# Patient Record
Sex: Male | Born: 1935 | Race: White | Hispanic: No | Marital: Married | Smoking: Former smoker
Health system: Southern US, Community
[De-identification: ages and names within clinical notes are randomized; demographics above are authoritative.]

## PROBLEM LIST (undated history)

## (undated) DIAGNOSIS — K219 Gastro-esophageal reflux disease without esophagitis: Secondary | ICD-10-CM

## (undated) DIAGNOSIS — I4891 Unspecified atrial fibrillation: Secondary | ICD-10-CM

## (undated) DIAGNOSIS — E785 Hyperlipidemia, unspecified: Secondary | ICD-10-CM

## (undated) DIAGNOSIS — I2510 Atherosclerotic heart disease of native coronary artery without angina pectoris: Secondary | ICD-10-CM

## (undated) DIAGNOSIS — I272 Pulmonary hypertension, unspecified: Secondary | ICD-10-CM

## (undated) DIAGNOSIS — J189 Pneumonia, unspecified organism: Secondary | ICD-10-CM

## (undated) DIAGNOSIS — J449 Chronic obstructive pulmonary disease, unspecified: Secondary | ICD-10-CM

## (undated) DIAGNOSIS — I10 Essential (primary) hypertension: Secondary | ICD-10-CM

## (undated) DIAGNOSIS — J9610 Chronic respiratory failure, unspecified whether with hypoxia or hypercapnia: Secondary | ICD-10-CM

---

## 2013-05-09 ENCOUNTER — Inpatient Hospital Stay
Admission: AD | Admit: 2013-05-09 | Discharge: 2013-06-01 | Disposition: A | Discharge status: Home Health | Payer: Medicare Other | Source: Ambulatory Visit | Attending: Internal Medicine | Admitting: Internal Medicine

## 2013-05-09 DIAGNOSIS — J9620 Acute and chronic respiratory failure, unspecified whether with hypoxia or hypercapnia: Secondary | ICD-10-CM

## 2013-05-09 DIAGNOSIS — J439 Emphysema, unspecified: Secondary | ICD-10-CM

## 2013-05-09 DIAGNOSIS — J189 Pneumonia, unspecified organism: Secondary | ICD-10-CM

## 2013-05-09 HISTORY — DX: Pulmonary hypertension, unspecified: I27.20

## 2013-05-09 HISTORY — DX: Chronic obstructive pulmonary disease, unspecified: J44.9

## 2013-05-09 HISTORY — DX: Unspecified atrial fibrillation: I48.91

## 2013-05-09 HISTORY — DX: Chronic respiratory failure, unspecified whether with hypoxia or hypercapnia: J96.10

## 2013-05-09 HISTORY — DX: Gastro-esophageal reflux disease without esophagitis: K21.9

## 2013-05-09 HISTORY — DX: Pneumonia, unspecified organism: J18.9

## 2013-05-09 HISTORY — DX: Essential (primary) hypertension: I10

## 2013-05-09 HISTORY — DX: Atherosclerotic heart disease of native coronary artery without angina pectoris: I25.10

## 2013-05-09 HISTORY — DX: Hyperlipidemia, unspecified: E78.5

## 2013-05-10 ENCOUNTER — Other Ambulatory Visit (HOSPITAL_COMMUNITY): Payer: Medicare Other

## 2013-05-10 LAB — COMPREHENSIVE METABOLIC PANEL
ALT: 12 U/L (ref 0–53)
AST: 14 U/L (ref 0–37)
Albumin: 2.6 g/dL — ABNORMAL LOW (ref 3.5–5.2)
Alkaline Phosphatase: 90 U/L (ref 39–117)
BILIRUBIN TOTAL: 1.3 mg/dL — AB (ref 0.3–1.2)
BUN: 39 mg/dL — AB (ref 6–23)
CHLORIDE: 102 meq/L (ref 96–112)
CO2: 28 meq/L (ref 19–32)
CREATININE: 1.24 mg/dL (ref 0.50–1.35)
Calcium: 8.5 mg/dL (ref 8.4–10.5)
GFR calc Af Amer: 62 mL/min — ABNORMAL LOW (ref >90)
GFR, EST NON AFRICAN AMERICAN: 54 mL/min — AB (ref >90)
Glucose, Bld: 221 mg/dL — ABNORMAL HIGH (ref 70–99)
Potassium: 4.2 mEq/L (ref 3.7–5.3)
Sodium: 144 mEq/L (ref 137–147)
Total Protein: 6.1 g/dL (ref 6.0–8.3)

## 2013-05-10 LAB — BLOOD GAS, ARTERIAL
Acid-Base Excess: 6.2 mmol/L — ABNORMAL HIGH (ref 0.0–2.0)
BICARBONATE: 30.1 meq/L — AB (ref 20.0–24.0)
FIO2: 0.7 %
O2 Saturation: 96 %
PATIENT TEMPERATURE: 98.6
PH ART: 7.456 — AB (ref 7.350–7.450)
PO2 ART: 77.4 mmHg — AB (ref 80.0–100.0)
TCO2: 31.4 mmol/L (ref 0–100)
pCO2 arterial: 43.3 mmHg (ref 35.0–45.0)

## 2013-05-10 LAB — CBC WITH DIFFERENTIAL/PLATELET
Basophils Absolute: 0 10*3/uL (ref 0.0–0.1)
Basophils Relative: 0 % (ref 0–1)
Eosinophils Absolute: 0.3 10*3/uL (ref 0.0–0.7)
Eosinophils Relative: 2 % (ref 0–5)
HEMATOCRIT: 24.8 % — AB (ref 39.0–52.0)
HEMOGLOBIN: 7.4 g/dL — AB (ref 13.0–17.0)
LYMPHS PCT: 11 % — AB (ref 12–46)
Lymphs Abs: 1.6 10*3/uL (ref 0.7–4.0)
MCH: 27.1 pg (ref 26.0–34.0)
MCHC: 29.8 g/dL — AB (ref 30.0–36.0)
MCV: 90.8 fL (ref 78.0–100.0)
MONO ABS: 1.6 10*3/uL — AB (ref 0.1–1.0)
MONOS PCT: 11 % (ref 3–12)
Neutro Abs: 11.7 10*3/uL — ABNORMAL HIGH (ref 1.7–7.7)
Neutrophils Relative %: 76 % (ref 43–77)
Platelets: 375 10*3/uL (ref 150–400)
RBC: 2.73 MIL/uL — ABNORMAL LOW (ref 4.22–5.81)
RDW: 17.5 % — ABNORMAL HIGH (ref 11.5–15.5)
WBC: 15.4 10*3/uL — AB (ref 4.0–10.5)

## 2013-05-10 LAB — VITAMIN B12: VITAMIN B 12: 345 pg/mL (ref 211–911)

## 2013-05-10 LAB — MAGNESIUM: MAGNESIUM: 2.1 mg/dL (ref 1.5–2.5)

## 2013-05-10 LAB — PREPARE RBC (CROSSMATCH)

## 2013-05-10 LAB — PHOSPHORUS: Phosphorus: 3 mg/dL (ref 2.3–4.6)

## 2013-05-10 LAB — PREALBUMIN: Prealbumin: 12.7 mg/dL — ABNORMAL LOW (ref 17.0–34.0)

## 2013-05-10 LAB — PROCALCITONIN: Procalcitonin: <0.1 ng/mL

## 2013-05-10 LAB — TSH: TSH: 1.08 u[IU]/mL (ref 0.350–4.500)

## 2013-05-10 LAB — ABO/RH: ABO/RH(D): A POS

## 2013-05-10 LAB — T4, FREE: Free T4: 1.52 ng/dL (ref 0.80–1.80)

## 2013-05-11 ENCOUNTER — Encounter: Payer: Self-pay | Admitting: Adult Health

## 2013-05-11 ENCOUNTER — Other Ambulatory Visit (HOSPITAL_COMMUNITY): Payer: Medicare Other

## 2013-05-11 ENCOUNTER — Other Ambulatory Visit (HOSPITAL_COMMUNITY): Payer: Self-pay

## 2013-05-11 DIAGNOSIS — J189 Pneumonia, unspecified organism: Secondary | ICD-10-CM

## 2013-05-11 LAB — BASIC METABOLIC PANEL
BUN: 53 mg/dL — AB (ref 6–23)
BUN: 57 mg/dL — ABNORMAL HIGH (ref 6–23)
CALCIUM: 8.2 mg/dL — AB (ref 8.4–10.5)
CO2: 28 mEq/L (ref 19–32)
CO2: 26 meq/L (ref 19–32)
CREATININE: 1.66 mg/dL — AB (ref 0.50–1.35)
Calcium: 8.4 mg/dL (ref 8.4–10.5)
Chloride: 101 mEq/L (ref 96–112)
Chloride: 101 mEq/L (ref 96–112)
Creatinine, Ser: 1.74 mg/dL — ABNORMAL HIGH (ref 0.50–1.35)
GFR calc Af Amer: 44 mL/min — ABNORMAL LOW (ref >90)
GFR calc Af Amer: 41 mL/min — ABNORMAL LOW (ref >90)
GFR, EST NON AFRICAN AMERICAN: 38 mL/min — AB (ref >90)
GFR, EST NON AFRICAN AMERICAN: 36 mL/min — AB (ref >90)
GLUCOSE: 138 mg/dL — AB (ref 70–99)
Glucose, Bld: 109 mg/dL — ABNORMAL HIGH (ref 70–99)
POTASSIUM: 4.4 meq/L (ref 3.7–5.3)
Potassium: 4.3 mEq/L (ref 3.7–5.3)
SODIUM: 141 meq/L (ref 137–147)
Sodium: 143 mEq/L (ref 137–147)

## 2013-05-11 LAB — BLOOD GAS, ARTERIAL
Acid-Base Excess: 2.8 mmol/L — ABNORMAL HIGH (ref 0.0–2.0)
Bicarbonate: 26.7 mEq/L — ABNORMAL HIGH (ref 20.0–24.0)
FIO2: 0.75 %
O2 Content: 40 L/min
O2 Saturation: 99.1 %
PCO2 ART: 40.1 mmHg (ref 35.0–45.0)
PH ART: 7.439 (ref 7.350–7.450)
Patient temperature: 98.6
TCO2: 27.9 mmol/L (ref 0–100)
pO2, Arterial: 124 mmHg — ABNORMAL HIGH (ref 80.0–100.0)

## 2013-05-11 LAB — CBC WITH DIFFERENTIAL/PLATELET
BASOS ABS: 0 10*3/uL (ref 0.0–0.1)
Basophils Relative: 0 % (ref 0–1)
Eosinophils Absolute: 0.2 10*3/uL (ref 0.0–0.7)
Eosinophils Relative: 1 % (ref 0–5)
HEMATOCRIT: 21.4 % — AB (ref 39.0–52.0)
Hemoglobin: 6.6 g/dL — CL (ref 13.0–17.0)
LYMPHS ABS: 1.7 10*3/uL (ref 0.7–4.0)
Lymphocytes Relative: 11 % — ABNORMAL LOW (ref 12–46)
MCH: 28 pg (ref 26.0–34.0)
MCHC: 30.8 g/dL (ref 30.0–36.0)
MCV: 90.7 fL (ref 78.0–100.0)
MONO ABS: 1.7 10*3/uL — AB (ref 0.1–1.0)
Monocytes Relative: 11 % (ref 3–12)
NEUTROS ABS: 12.1 10*3/uL — AB (ref 1.7–7.7)
Neutrophils Relative %: 77 % (ref 43–77)
Platelets: 370 10*3/uL (ref 150–400)
RBC: 2.36 MIL/uL — ABNORMAL LOW (ref 4.22–5.81)
RDW: 18 % — AB (ref 11.5–15.5)
WBC: 15.7 10*3/uL — ABNORMAL HIGH (ref 4.0–10.5)

## 2013-05-11 LAB — PREALBUMIN: Prealbumin: 12.3 mg/dL — ABNORMAL LOW (ref 17.0–34.0)

## 2013-05-11 LAB — CK TOTAL AND CKMB (NOT AT ARMC)
CK, MB: <1 ng/mL (ref 0.3–4.0)
Total CK: 32 U/L (ref 7–232)

## 2013-05-11 LAB — RENAL FUNCTION PANEL
Albumin: 2.3 g/dL — ABNORMAL LOW (ref 3.5–5.2)
BUN: 57 mg/dL — ABNORMAL HIGH (ref 6–23)
CHLORIDE: 102 meq/L (ref 96–112)
CO2: 28 meq/L (ref 19–32)
Calcium: 8.4 mg/dL (ref 8.4–10.5)
Creatinine, Ser: 1.79 mg/dL — ABNORMAL HIGH (ref 0.50–1.35)
GFR calc Af Amer: 40 mL/min — ABNORMAL LOW (ref >90)
GFR, EST NON AFRICAN AMERICAN: 34 mL/min — AB (ref >90)
GLUCOSE: 112 mg/dL — AB (ref 70–99)
POTASSIUM: 4.3 meq/L (ref 3.7–5.3)
Phosphorus: 4.5 mg/dL (ref 2.3–4.6)
SODIUM: 142 meq/L (ref 137–147)

## 2013-05-11 LAB — HIV ANTIBODY (ROUTINE TESTING W REFLEX): HIV: NONREACTIVE

## 2013-05-11 LAB — TROPONIN I

## 2013-05-11 LAB — SEDIMENTATION RATE: Sed Rate: 55 mm/hr — ABNORMAL HIGH (ref 0–16)

## 2013-05-11 LAB — FOLATE RBC: RBC Folate: 1571 ng/mL — ABNORMAL HIGH (ref >280)

## 2013-05-11 LAB — PRO B NATRIURETIC PEPTIDE: Pro B Natriuretic peptide (BNP): 590.3 pg/mL — ABNORMAL HIGH (ref 0–450)

## 2013-05-11 NOTE — Consult Note (Signed)
Name: Scott Mejia MRN: [id number] DOB: Jun 11, 1931    ADMISSION DATE:  05/09/2013 CONSULTATION DATE:  05/11/13  REFERRING MD :  Laren Everts PRIMARY SERVICE:  [facility]  CHIEF COMPLAINT:  Recurrent PNA, hypoxia   BRIEF PATIENT DESCRIPTION: 79 yo male with hx COPD on home O2, Afib on xarelto, pulmonary HTN [facility] after admission for HCAP.  Has had recurrent PNA (5 hospitalization for PNA in last 1 year) with suspicion for recurrent aspiration.     SIGNIFICANT EVENTS / STUDIES:   2D echo ([place]) >>>"EF 60%,  Elevated pulmonary pressures" VQ scan ([place])>>> low probability PE   LINES / TUBES:   CULTURES:   ANTIBIOTICS: Levaquin 4/6>>>  HISTORY OF PRESENT ILLNESS:  80 yo male with hx COPD on home O2, Afib on xarelto, pulmonary HTN [facility] after admission for HCAP.  Has had recurrent PNA (5 hospitalization for PNA in last 1 year) with suspicion for recurrent aspiration.  Per pastor at bedside has been declining over the last year overall.  Per nsg pt has refused to follow dysphagia diet.  Currently very drowsy but denies significant SOB, chest pain, difficulty swallowing, productive cough.   PAST MEDICAL HISTORY :  Past Medical History  Diagnosis Date  . COPD (chronic obstructive pulmonary disease)   . Pulmonary HTN   . Chronic respiratory failure   . CAD (coronary artery disease)   . A-fib   . HTN (hypertension)   . Recurrent pneumonia   . GERD (gastroesophageal reflux disease)   . Hyperlipidemia    No past surgical history on file. Prior to Admission medications   Not on File   Allergies not on file  FAMILY HISTORY:  No family history on file. SOCIAL HISTORY:  reports that he has quit smoking. He does not have any smokeless tobacco history on file. His alcohol and drug histories are not on file.  REVIEW OF SYSTEMS:   As per HPI - All other systems reviewed and were neg.    VITAL SIGNS:   Reviewed.    PHYSICAL EXAMINATION: General:  Chronically ill appearing male, NAD  Neuro:  Drowsy but easily arousable, answers questions appropriately, MAE, gen weakness  HEENT:  Mm moist, POS JVD  Cardiovascular:  s1s2 rrr Lungs:  resps even non labored on high flow [place]  Abdomen:  Soft, nt, +bs Musculoskeletal:  Warm and dry, 3+ pitting edema BLE    Recent Labs Lab 05/10/13 0915 05/11/13 0713  NA 144 143  K 4.2 4.4  CL 102 101  CO2 28 28  BUN 39* 53*  CREATININE 1.24 1.66*  GLUCOSE 221* 138*    Recent Labs Lab 05/10/13 0915 05/11/13 0713  HGB 7.4* 6.6*  HCT 24.8* 21.4*  WBC 15.4* 15.7*  PLT 375 370   Dg Chest Port 1 View  05/11/2013   CLINICAL DATA:  Pneumonia.  EXAM: PORTABLE CHEST - 1 VIEW  COMPARISON:  DG CHEST 1V PORT dated 05/10/2013  FINDINGS: Cardiomegaly. Prior CABG. Diffuse bilateral pulmonary interstitial infiltrates are present. These veins are consistent congestive heart failure with pulmonary interstitial edema and/or bilateral pneumonia. No interim change from prior exam. No pneumothorax. No acute bony abnormality.  IMPRESSION: 1. Cardiomegaly .  Prior CABG. 2. Dense bilateral pulmonary interstitial infiltrates remain. No change from prior exam. Findings consistent with interstitial edema and/or bilateral pneumonia.   Electronically Signed   By: Marcello Moores  Register   On: 05/11/2013 13:03   Dg Chest Port 1  View  05/10/2013   CLINICAL DATA:  Respiratory failure, COPD  EXAM: PORTABLE CHEST - 1 VIEW  COMPARISON:  None.  FINDINGS: There is airspace disease throughout both mid and lower lung fields. This is consistent with either pneumonia or possibly edema. Mild cardiomegaly is present. Median sternotomy sutures are noted from prior CABG. Probable bullous changes are present in both lung apices.  IMPRESSION: Airspace disease primarily in the mid and lower lung fields consistent with either pneumonia or edema with probable bullous change in the apices.   Electronically Signed   By: Ivar Drape M.D.   On: 05/10/2013 08:09    ASSESSMENT / PLAN:  Acute on chronic respiratory failure - Unclear infiltrates ( infectious (asp chonic) vs NON infectious, amio?) multifactorial in setting COPD, pulm HTN, presumed recurrent aspiration PNA and volume overload.  Amiodarone? Active med just confirmed toxicity? COPD  Recurrent PNA - presumed aspiration   PLAN -  Esr, anca, anti gbm, c3, c4, ana, dsdna Cont O2 high flow, with pulm htn avoid sats less 92% Speech eval and objective swallow test  abx per primary  Consider CT chest (ordered), and neck (fullness, lymph nodes?) Check HIV with recurrent PNA  F/u CXR in am  Echo for exact pa pressures Diuresis as BP and Scr tolerate  Hold amiodarone in setting bilat pulm infiltrates and sig hypoxia  If esr up, and no sig infectious etiology noted, would add steroids Will follow up in am   If indeed he has significant aspiration as the cause of his recurrent PNA, need to establish goals of care with pt who has been refusing to follow dysphagia guidelines.   Marijean Heath, NP 05/11/2013  3:38 PM Pager: [phone] or [phone]  *Care during the described time interval was provided by me and/or other providers on the critical care team. I have reviewed this patient's available data, including medical history, events of note, physical examination and test results as part of my evaluation.   Lavon Paganini. Titus Mould, MD, [place] Pgr: [facility]

## 2013-05-12 ENCOUNTER — Other Ambulatory Visit (HOSPITAL_COMMUNITY): Payer: Self-pay

## 2013-05-12 ENCOUNTER — Other Ambulatory Visit (HOSPITAL_COMMUNITY): Payer: Medicare Other

## 2013-05-12 LAB — CBC WITH DIFFERENTIAL/PLATELET
Basophils Absolute: 0.1 10*3/uL (ref 0.0–0.1)
Basophils Relative: 0 % (ref 0–1)
EOS ABS: 0.2 10*3/uL (ref 0.0–0.7)
Eosinophils Relative: 1 % (ref 0–5)
HCT: 23.8 % — ABNORMAL LOW (ref 39.0–52.0)
Hemoglobin: 7.7 g/dL — ABNORMAL LOW (ref 13.0–17.0)
LYMPHS PCT: 10 % — AB (ref 12–46)
Lymphs Abs: 1.4 10*3/uL (ref 0.7–4.0)
MCH: 28.7 pg (ref 26.0–34.0)
MCHC: 32.4 g/dL (ref 30.0–36.0)
MCV: 88.8 fL (ref 78.0–100.0)
Monocytes Absolute: 1.4 10*3/uL — ABNORMAL HIGH (ref 0.1–1.0)
Monocytes Relative: 10 % (ref 3–12)
Neutro Abs: 11.4 10*3/uL — ABNORMAL HIGH (ref 1.7–7.7)
Neutrophils Relative %: 79 % — ABNORMAL HIGH (ref 43–77)
PLATELETS: 290 10*3/uL (ref 150–400)
RBC: 2.68 MIL/uL — AB (ref 4.22–5.81)
RDW: 18.3 % — AB (ref 11.5–15.5)
WBC: 14.5 10*3/uL — AB (ref 4.0–10.5)

## 2013-05-12 LAB — CK TOTAL AND CKMB (NOT AT ARMC)
CK, MB: 1 ng/mL (ref 0.3–4.0)
Relative Index: INVALID (ref 0.0–2.5)
Total CK: 28 U/L (ref 7–232)

## 2013-05-12 LAB — IRON AND TIBC
IRON: 42 ug/dL (ref 42–135)
Saturation Ratios: 17 % — ABNORMAL LOW (ref 20–55)
TIBC: 246 ug/dL (ref 215–435)
UIBC: 204 ug/dL (ref 125–400)

## 2013-05-12 LAB — TROPONIN I

## 2013-05-12 LAB — MPO/PR-3 (ANCA) ANTIBODIES

## 2013-05-12 LAB — BASIC METABOLIC PANEL
BUN: 61 mg/dL — AB (ref 6–23)
CHLORIDE: 102 meq/L (ref 96–112)
CO2: 26 meq/L (ref 19–32)
CREATININE: 1.92 mg/dL — AB (ref 0.50–1.35)
Calcium: 8.1 mg/dL — ABNORMAL LOW (ref 8.4–10.5)
GFR calc Af Amer: 37 mL/min — ABNORMAL LOW (ref >90)
GFR calc non Af Amer: 32 mL/min — ABNORMAL LOW (ref >90)
Glucose, Bld: 136 mg/dL — ABNORMAL HIGH (ref 70–99)
Potassium: 4.4 mEq/L (ref 3.7–5.3)
Sodium: 144 mEq/L (ref 137–147)

## 2013-05-12 LAB — C4 COMPLEMENT: Complement C4, Body Fluid: 23 mg/dL (ref 10–40)

## 2013-05-12 LAB — GLOMERULAR BASEMENT MEMBRANE ANTIBODIES

## 2013-05-12 LAB — C3 COMPLEMENT: C3 Complement: 114 mg/dL (ref 90–180)

## 2013-05-12 LAB — FERRITIN: Ferritin: 198 ng/mL (ref 22–322)

## 2013-05-12 LAB — ANTI-DNA ANTIBODY, DOUBLE-STRANDED: ds DNA Ab: <1 IU/mL

## 2013-05-13 ENCOUNTER — Other Ambulatory Visit (HOSPITAL_COMMUNITY): Payer: Medicare Other

## 2013-05-13 DIAGNOSIS — I517 Cardiomegaly: Secondary | ICD-10-CM

## 2013-05-13 LAB — BASIC METABOLIC PANEL
BUN: 56 mg/dL — ABNORMAL HIGH (ref 6–23)
CALCIUM: 8.1 mg/dL — AB (ref 8.4–10.5)
CO2: 30 meq/L (ref 19–32)
CREATININE: 1.77 mg/dL — AB (ref 0.50–1.35)
Chloride: 104 mEq/L (ref 96–112)
GFR, EST AFRICAN AMERICAN: 40 mL/min — AB (ref >90)
GFR, EST NON AFRICAN AMERICAN: 35 mL/min — AB (ref >90)
Glucose, Bld: 199 mg/dL — ABNORMAL HIGH (ref 70–99)
Potassium: 3.8 mEq/L (ref 3.7–5.3)
SODIUM: 146 meq/L (ref 137–147)

## 2013-05-13 LAB — PREPARE RBC (CROSSMATCH)

## 2013-05-13 LAB — CBC WITH DIFFERENTIAL/PLATELET
BASOS PCT: 0 % (ref 0–1)
Basophils Absolute: 0 10*3/uL (ref 0.0–0.1)
EOS ABS: 0.2 10*3/uL (ref 0.0–0.7)
Eosinophils Relative: 1 % (ref 0–5)
HEMATOCRIT: 20.7 % — AB (ref 39.0–52.0)
Hemoglobin: 6.7 g/dL — CL (ref 13.0–17.0)
Lymphocytes Relative: 8 % — ABNORMAL LOW (ref 12–46)
Lymphs Abs: 1.3 10*3/uL (ref 0.7–4.0)
MCH: 29.1 pg (ref 26.0–34.0)
MCHC: 32.4 g/dL (ref 30.0–36.0)
MCV: 90 fL (ref 78.0–100.0)
MONO ABS: 1.3 10*3/uL — AB (ref 0.1–1.0)
Monocytes Relative: 8 % (ref 3–12)
Neutro Abs: 12.5 10*3/uL — ABNORMAL HIGH (ref 1.7–7.7)
Neutrophils Relative %: 82 % — ABNORMAL HIGH (ref 43–77)
PLATELETS: 291 10*3/uL (ref 150–400)
RBC: 2.3 MIL/uL — ABNORMAL LOW (ref 4.22–5.81)
RDW: 18.6 % — ABNORMAL HIGH (ref 11.5–15.5)
WBC: 15.3 10*3/uL — ABNORMAL HIGH (ref 4.0–10.5)

## 2013-05-13 LAB — HEMOGLOBIN AND HEMATOCRIT, BLOOD
HCT: 28.1 % — ABNORMAL LOW (ref 39.0–52.0)
HEMOGLOBIN: 9 g/dL — AB (ref 13.0–17.0)

## 2013-05-13 NOTE — Consult Note (Signed)
Name: Scott Mejia MRN: [id number] DOB: 1931/08/11    ADMISSION DATE:  05/09/2013 CONSULTATION DATE:  05/11/13  REFERRING MD :  Laren Everts PRIMARY SERVICE:  [facility]  CHIEF COMPLAINT:  Recurrent PNA, hypoxia   BRIEF PATIENT DESCRIPTION: 80 y/o male with hx COPD on home O2, Afib on xarelto, pulmonary HTN [facility] after admission for HCAP.  Has had recurrent PNA (5 hospitalization for PNA in last 1 year) with suspicion for recurrent aspiration.     SIGNIFICANT EVENTS / STUDIES:   2D echo [facility]) >>>"EF 60%,  Elevated pulmonary pressures" VQ scan ([place])>>> low probability PE  ............................................................... 4/10 - 40LPM high flow O2, sats 90%, no distress, on lasix gtt  LINES / TUBES:   CULTURES:   ANTIBIOTICS: Levaquin 4/6>>>   VITAL SIGNS: Reviewed.   PHYSICAL EXAMINATION: General:  Chronically ill appearing male, NAD.  Up to chair Neuro:  AAOx4, speech clear, MAE  HEENT:  Mm moist, POS JVD  Cardiovascular:  s1s2 rrr Lungs:  resps even non labored on high flow [place], lungs bilaterally diminished Abdomen:  Soft, nt, +bs Musculoskeletal:  Warm and dry, 2+ pitting edema BLE    Recent Labs Lab 05/11/13 1620 05/12/13 0427 05/13/13 0500  NA 141  142 144 146  K 4.3  4.3 4.4 3.8  CL 101  102 102 104  CO2 26  28 26 30   BUN 57*  57* 61* 56*  CREATININE 1.74*  1.79* 1.92* 1.77*  GLUCOSE 109*  112* 136* 199*    Recent Labs Lab 05/11/13 0713 05/12/13 0427 05/13/13 0500  HGB 6.6* 7.7* 6.7*  HCT 21.4* 23.8* 20.7*  WBC 15.7* 14.5* 15.3*  PLT 370 290 291   Ct Soft Tissue Neck Wo Contrast  05/12/2013   CLINICAL DATA:  Infiltrates.  Shortness of breath  EXAM: CT NECK WITHOUT CONTRAST  TECHNIQUE: Multidetector CT imaging of the neck was performed following the standard protocol without intravenous contrast.  COMPARISON:  None.  FINDINGS: Patent airway. As permitted by noncontrast technique and pharyngeal  motion, no evidence of mass or edema along the surfaces of the aerodigestive tract. Solitary bilateral parotid stones. No evidence of sialoadenitis. Negative for cervical lymphadenopathy. Bilateral cervical carotid atherosclerosis. Bilateral cataract resection.  IMPRESSION: Patent airway.  No neck findings to explain shortness of breath.   Electronically Signed   By: Jorje Guild M.D.   On: 05/12/2013 03:42   Ct Chest Wo Contrast  05/12/2013   CLINICAL DATA:  Infiltrates.  EXAM: CT CHEST WITHOUT CONTRAST  TECHNIQUE: Multidetector CT imaging of the chest was performed following the standard protocol without IV contrast.  COMPARISON:  None.  FINDINGS: THORACIC INLET/BODY WALL:  Presumed dermal inclusion cyst in the the paramedian right lower back.  MEDIASTINUM:  Mild cardiomegaly. No pericardial effusion. Status post coronary artery bypass grafting. No evidence of acute vascular abnormality. There is a prominent right lower paratracheal node measuring 18 x 11 mm.  LUNG WINDOWS:  Background of moderate centrilobular and panlobular emphysema. There is patchy predominately interstitial opacities in the lower lungs. The intervening lung, even in the lower lobe regions, shows no interlobular septal thickening to suggest edema. Small area of dense consolidation is noted in the left posterior costophrenic sulcus. Diffuse bronchial wall thickening. There is a 16 mm lobulated pulmonary nodule in the lower aspect of the right upper lobe, which appears different than the presumably infectious/inflammatory opacities. The opacity extends to the major fissure, but does not deform it. No significant  pleural effusion. No pneumothorax.  UPPER ABDOMEN:  No acute findings.  OSSEOUS:  Bridging osteophytes or syndesmophytes of the mid and lower thoracic spine.  IMPRESSION: 1. Interstitial opacities in the lower lungs is most consistent with an infectious (especially viral) or inflammatory pneumonia. 2. 16 mm nodule in the right upper  lobe is concerning for a bronchogenic carcinoma. Followup chest CT or PET-CT recommended in 6 to 8 weeks (to allow for clearing of the suspected pneumonia). 3. Prominent right peritracheal lymph node, significance to be determined by the nodule workup. 4. Moderate emphysema.   Electronically Signed   By: Jorje Guild M.D.   On: 05/12/2013 03:38   Dg Chest Port 1 View  05/13/2013   CLINICAL DATA:  Congestive heart failure.  Pneumonia.  EXAM: PORTABLE CHEST - 1 VIEW  COMPARISON:  DG CHEST 1V PORT dated 05/12/2013; CT CHEST W/O CM dated 05/11/2013; DG CHEST 1V PORT dated 05/10/2013  FINDINGS: Heart is mildly enlarged. The patient is status post median sternotomy for CABG.  Interstitial and airspace opacifications are again seen in the lower lobes bilaterally, left greater than right, without significant interval change. The visualized soft tissues and bony thorax are unremarkable.  IMPRESSION: 1. Stable appearance of interstitial and airspace disease in the lower lobes bilaterally.   Electronically Signed   By: Lawrence Santiago M.D.   On: 05/13/2013 07:43   Dg Chest Port 1 View  05/12/2013   CLINICAL DATA:  PICC line placement.  EXAM: PORTABLE CHEST - 1 VIEW  COMPARISON:  Earlier today.  FINDINGS: There has been placement of a right-sided PICC line with tip overlying the region of the SVC. There is continued stable mixed interstitial airspace density over the mid to lower lungs with suggestion of emphysematous disease of the upper lobes. Cardiomediastinal silhouette and remainder of the exam is unchanged.  IMPRESSION: Stable moderate mixed interstitial airspace process over the mid to lower lungs.  Right-sided PICC line with tip overlying the region of the SVC.   Electronically Signed   By: Marin Olp M.D.   On: 05/12/2013 23:36   Dg Chest Port 1 View  05/12/2013   CLINICAL DATA:  Followup infiltrate  EXAM: PORTABLE CHEST - 1 VIEW  COMPARISON:  05/11/2013  FINDINGS: Progression of bibasilar infiltrates since  yesterday. This may be due to pneumonia. Progression of left pleural effusion.  Prior CABG.  Underlying COPD.  IMPRESSION: Progression of bibasilar infiltrates.   Electronically Signed   By: Franchot Gallo M.D.   On: 05/12/2013 08:04    ASSESSMENT / PLAN:  Acute on chronic respiratory failure - Unclear infiltrates ( infectious (asp chonic) vs NON infectious, amio?) multifactorial in setting COPD, pulm HTN, presumed recurrent aspiration PNA and volume overload.  Amiodarone toxicity ? COPD  Recurrent PNA - presumed aspiration  Pulmonary Nodule - RUL, was known to Dr. Koleen Nimrod in [place], anca, anti gbm, c3, c4, ana, dsdna, HIV>> negative -Cont O2 high flow, with pulm htn avoid sats less 92% -Speech eval and objective swallow test >> pt does not want to follow the diet, may explain recurrent pna -abx per primary  -F/u CXR in am  -await ECHO for PA pressures -Diuresis as BP and Scr tolerate, lasix gtt per primary -Hold amiodarone in setting bilat pulm infiltrates and sig hypoxia  -no role for steroids -would recommend no intubation, discussed with patient & family.  Continue ongoing discussion re: intubation & goals of care. Likely aspiration playing a large role  here and pt does not want to follow the prescribed diet. -obtain previous CT results from [facility] for nodule comparison      Noe Gens, NP-C [facility] Pgr: [phone] or [phone]   Baltazar Apo, MD, PhD 05/14/2013, 2:54 PM [facility] [phone] or if no answer [phone]

## 2013-05-13 NOTE — Progress Notes (Signed)
  Echocardiogram 2D Echocardiogram has been performed.  Hermine MessickLauren A Williams 05/13/2013, 2:16 PM

## 2013-05-14 ENCOUNTER — Other Ambulatory Visit (HOSPITAL_COMMUNITY): Payer: Medicare Other

## 2013-05-14 LAB — CBC WITH DIFFERENTIAL/PLATELET
BASOS PCT: 0 % (ref 0–1)
Basophils Absolute: 0 10*3/uL (ref 0.0–0.1)
EOS ABS: 0.3 10*3/uL (ref 0.0–0.7)
Eosinophils Relative: 2 % (ref 0–5)
HCT: 27.4 % — ABNORMAL LOW (ref 39.0–52.0)
Hemoglobin: 8.8 g/dL — ABNORMAL LOW (ref 13.0–17.0)
LYMPHS ABS: 1.6 10*3/uL (ref 0.7–4.0)
Lymphocytes Relative: 9 % — ABNORMAL LOW (ref 12–46)
MCH: 29 pg (ref 26.0–34.0)
MCHC: 32.1 g/dL (ref 30.0–36.0)
MCV: 90.4 fL (ref 78.0–100.0)
Monocytes Absolute: 1.4 10*3/uL — ABNORMAL HIGH (ref 0.1–1.0)
Monocytes Relative: 8 % (ref 3–12)
Neutro Abs: 15 10*3/uL — ABNORMAL HIGH (ref 1.7–7.7)
Neutrophils Relative %: 82 % — ABNORMAL HIGH (ref 43–77)
PLATELETS: 343 10*3/uL (ref 150–400)
RBC: 3.03 MIL/uL — AB (ref 4.22–5.81)
RDW: 17.8 % — ABNORMAL HIGH (ref 11.5–15.5)
WBC: 18.3 10*3/uL — ABNORMAL HIGH (ref 4.0–10.5)

## 2013-05-14 LAB — BASIC METABOLIC PANEL
BUN: 44 mg/dL — ABNORMAL HIGH (ref 6–23)
CALCIUM: 8.4 mg/dL (ref 8.4–10.5)
CO2: 33 mEq/L — ABNORMAL HIGH (ref 19–32)
Chloride: 101 mEq/L (ref 96–112)
Creatinine, Ser: 1.35 mg/dL (ref 0.50–1.35)
GFR, EST AFRICAN AMERICAN: 56 mL/min — AB (ref >90)
GFR, EST NON AFRICAN AMERICAN: 48 mL/min — AB (ref >90)
GLUCOSE: 132 mg/dL — AB (ref 70–99)
Potassium: 3.9 mEq/L (ref 3.7–5.3)
SODIUM: 145 meq/L (ref 137–147)

## 2013-05-14 LAB — BLOOD GAS, ARTERIAL
Acid-Base Excess: 9.5 mmol/L — ABNORMAL HIGH (ref 0.0–2.0)
Bicarbonate: 33.4 mEq/L — ABNORMAL HIGH (ref 20.0–24.0)
FIO2: 0.75 %
O2 Saturation: 92 %
PATIENT TEMPERATURE: 98.6
PCO2 ART: 44.7 mmHg (ref 35.0–45.0)
PO2 ART: 58.2 mmHg — AB (ref 80.0–100.0)
TCO2: 34.8 mmol/L (ref 0–100)
pH, Arterial: 7.486 — ABNORMAL HIGH (ref 7.350–7.450)

## 2013-05-14 LAB — EXPECTORATED SPUTUM ASSESSMENT W REFEX TO RESP CULTURE

## 2013-05-14 LAB — MAGNESIUM: Magnesium: 2.3 mg/dL (ref 1.5–2.5)

## 2013-05-14 LAB — PHOSPHORUS: Phosphorus: 3.3 mg/dL (ref 2.3–4.6)

## 2013-05-15 ENCOUNTER — Other Ambulatory Visit (HOSPITAL_COMMUNITY): Payer: Medicare Other

## 2013-05-15 LAB — TYPE AND SCREEN
ABO/RH(D): A POS
Antibody Screen: NEGATIVE
UNIT DIVISION: 0
UNIT DIVISION: 0
Unit division: 0
Unit division: 0
Unit division: 0
Unit division: 0

## 2013-05-15 LAB — CBC WITH DIFFERENTIAL/PLATELET
BASOS PCT: 2 % — AB (ref 0–1)
Basophils Absolute: 0.2 10*3/uL — ABNORMAL HIGH (ref 0.0–0.1)
Eosinophils Absolute: 1.1 10*3/uL — ABNORMAL HIGH (ref 0.0–0.7)
Eosinophils Relative: 10 % — ABNORMAL HIGH (ref 0–5)
HCT: 32.7 % — ABNORMAL LOW (ref 39.0–52.0)
HEMOGLOBIN: 9.6 g/dL — AB (ref 13.0–17.0)
LYMPHS PCT: 10 % — AB (ref 12–46)
Lymphs Abs: 1.1 10*3/uL (ref 0.7–4.0)
MCH: 26.3 pg (ref 26.0–34.0)
MCHC: 29.4 g/dL — AB (ref 30.0–36.0)
MCV: 89.6 fL (ref 78.0–100.0)
MONO ABS: 1 10*3/uL (ref 0.1–1.0)
Monocytes Relative: 9 % (ref 3–12)
Neutro Abs: 7.6 10*3/uL (ref 1.7–7.7)
Neutrophils Relative %: 69 % (ref 43–77)
Platelets: 684 10*3/uL — ABNORMAL HIGH (ref 150–400)
RBC: 3.65 MIL/uL — ABNORMAL LOW (ref 4.22–5.81)
RDW: 22.6 % — ABNORMAL HIGH (ref 11.5–15.5)
WBC: 11 10*3/uL — ABNORMAL HIGH (ref 4.0–10.5)

## 2013-05-15 LAB — BLOOD GAS, ARTERIAL
ACID-BASE EXCESS: 9.7 mmol/L — AB (ref 0.0–2.0)
Bicarbonate: 33.9 mEq/L — ABNORMAL HIGH (ref 20.0–24.0)
FIO2: 75 %
O2 Saturation: 95.9 %
Patient temperature: 98.6
TCO2: 35.4 mmol/L (ref 0–100)
pCO2 arterial: 48.2 mmHg — ABNORMAL HIGH (ref 35.0–45.0)
pH, Arterial: 7.461 — ABNORMAL HIGH (ref 7.350–7.450)
pO2, Arterial: 72.2 mmHg — ABNORMAL LOW (ref 80.0–100.0)

## 2013-05-15 LAB — BASIC METABOLIC PANEL
BUN: 43 mg/dL — AB (ref 6–23)
CO2: 31 mEq/L (ref 19–32)
Calcium: 8.5 mg/dL (ref 8.4–10.5)
Chloride: 103 mEq/L (ref 96–112)
Creatinine, Ser: 1.31 mg/dL (ref 0.50–1.35)
GFR calc Af Amer: 58 mL/min — ABNORMAL LOW (ref >90)
GFR calc non Af Amer: 50 mL/min — ABNORMAL LOW (ref >90)
Glucose, Bld: 132 mg/dL — ABNORMAL HIGH (ref 70–99)
POTASSIUM: 3.8 meq/L (ref 3.7–5.3)
Sodium: 146 mEq/L (ref 137–147)

## 2013-05-15 LAB — PRO B NATRIURETIC PEPTIDE: Pro B Natriuretic peptide (BNP): 2456 pg/mL — ABNORMAL HIGH (ref 0–450)

## 2013-05-16 ENCOUNTER — Other Ambulatory Visit (HOSPITAL_COMMUNITY): Payer: Self-pay

## 2013-05-16 ENCOUNTER — Other Ambulatory Visit (HOSPITAL_COMMUNITY): Payer: Medicare Other

## 2013-05-16 DIAGNOSIS — J439 Emphysema, unspecified: Secondary | ICD-10-CM

## 2013-05-16 DIAGNOSIS — J9620 Acute and chronic respiratory failure, unspecified whether with hypoxia or hypercapnia: Secondary | ICD-10-CM

## 2013-05-16 LAB — CBC WITH DIFFERENTIAL/PLATELET
Basophils Absolute: 0.1 10*3/uL (ref 0.0–0.1)
Basophils Relative: 0 % (ref 0–1)
Eosinophils Absolute: 0.5 10*3/uL (ref 0.0–0.7)
Eosinophils Relative: 3 % (ref 0–5)
HEMATOCRIT: 24.8 % — AB (ref 39.0–52.0)
HEMOGLOBIN: 7.7 g/dL — AB (ref 13.0–17.0)
LYMPHS ABS: 1.5 10*3/uL (ref 0.7–4.0)
LYMPHS PCT: 8 % — AB (ref 12–46)
MCH: 28.7 pg (ref 26.0–34.0)
MCHC: 31 g/dL (ref 30.0–36.0)
MCV: 92.5 fL (ref 78.0–100.0)
MONO ABS: 1.5 10*3/uL — AB (ref 0.1–1.0)
MONOS PCT: 9 % (ref 3–12)
NEUTROS ABS: 13.9 10*3/uL — AB (ref 1.7–7.7)
Neutrophils Relative %: 80 % — ABNORMAL HIGH (ref 43–77)
Platelets: 339 10*3/uL (ref 150–400)
RBC: 2.68 MIL/uL — ABNORMAL LOW (ref 4.22–5.81)
RDW: 17.5 % — ABNORMAL HIGH (ref 11.5–15.5)
WBC: 17.5 10*3/uL — AB (ref 4.0–10.5)

## 2013-05-16 LAB — PREALBUMIN: Prealbumin: 11.8 mg/dL — ABNORMAL LOW (ref 17.0–34.0)

## 2013-05-16 LAB — VANCOMYCIN, TROUGH
VANCOMYCIN TR: 18.8 ug/mL (ref 10.0–20.0)
Vancomycin Tr: 22.7 ug/mL — ABNORMAL HIGH (ref 10.0–20.0)

## 2013-05-16 LAB — BLOOD GAS, ARTERIAL
ACID-BASE EXCESS: 7.4 mmol/L — AB (ref 0.0–2.0)
Bicarbonate: 31.6 mEq/L — ABNORMAL HIGH (ref 20.0–24.0)
DELIVERY SYSTEMS: POSITIVE
FIO2: 0.4 %
O2 Saturation: 88.5 %
PCO2 ART: 41.7 mmHg (ref 35.0–45.0)
PH ART: 7.481 — AB (ref 7.350–7.450)
Patient temperature: 95
RATE: 12 resp/min
TCO2: 33 mmol/L (ref 0–100)
pO2, Arterial: 47.9 mmHg — ABNORMAL LOW (ref 80.0–100.0)

## 2013-05-16 LAB — HEMOGLOBIN AND HEMATOCRIT, BLOOD
HCT: 26.9 % — ABNORMAL LOW (ref 39.0–52.0)
Hemoglobin: 8.3 g/dL — ABNORMAL LOW (ref 13.0–17.0)

## 2013-05-16 LAB — CBC
HCT: 26.2 % — ABNORMAL LOW (ref 39.0–52.0)
Hemoglobin: 8.3 g/dL — ABNORMAL LOW (ref 13.0–17.0)
MCH: 29 pg (ref 26.0–34.0)
MCHC: 31.7 g/dL (ref 30.0–36.0)
MCV: 91.6 fL (ref 78.0–100.0)
Platelets: 390 10*3/uL (ref 150–400)
RBC: 2.86 MIL/uL — ABNORMAL LOW (ref 4.22–5.81)
RDW: 17.4 % — AB (ref 11.5–15.5)
WBC: 18.5 10*3/uL — ABNORMAL HIGH (ref 4.0–10.5)

## 2013-05-16 LAB — PHOSPHORUS: Phosphorus: 2.7 mg/dL (ref 2.3–4.6)

## 2013-05-16 LAB — BASIC METABOLIC PANEL
BUN: 38 mg/dL — AB (ref 6–23)
CHLORIDE: 103 meq/L (ref 96–112)
CO2: 30 meq/L (ref 19–32)
Calcium: 8.1 mg/dL — ABNORMAL LOW (ref 8.4–10.5)
Creatinine, Ser: 1.24 mg/dL (ref 0.50–1.35)
GFR calc Af Amer: 62 mL/min — ABNORMAL LOW (ref >90)
GFR calc non Af Amer: 54 mL/min — ABNORMAL LOW (ref >90)
Glucose, Bld: 122 mg/dL — ABNORMAL HIGH (ref 70–99)
Potassium: 3.8 mEq/L (ref 3.7–5.3)
Sodium: 144 mEq/L (ref 137–147)

## 2013-05-16 LAB — MAGNESIUM: Magnesium: 2.5 mg/dL (ref 1.5–2.5)

## 2013-05-16 NOTE — Progress Notes (Signed)
Name: Scott Mejia MRN: [id number] DOB: 04-09-1931    ADMISSION DATE:  05/09/2013 CONSULTATION DATE:  05/11/13  REFERRING MD :  Laren Everts PRIMARY SERVICE:  [facility]  CHIEF COMPLAINT:  Recurrent PNA, hypoxia   BRIEF PATIENT DESCRIPTION: 80 y/o male with hx COPD on home O2, Afib on xarelto, pulmonary HTN [facility] after admission for HCAP.  Has had recurrent PNA (5 hospitalization for PNA in last 1 year) with suspicion for recurrent aspiration.     SIGNIFICANT EVENTS / STUDIES:   2D echo [facility]) >>>"EF 60%,  Elevated pulmonary pressures" VQ scan ([place])>>> low probability PE  ............................................................... 4/10 - 40LPM high flow O2, sats 90%, no distress, on lasix gtt 4/10 - ECHO >> EF 60-65%, nml wall motion, PA peak 61 4/13 - O2 reduced to 30 LPM, 65% high flow O2   CULTURES:   ANTIBIOTICS: Levaquin 4/6>>>   VITAL SIGNS: Reviewed.   PHYSICAL EXAMINATION: General:  Chronically ill appearing male, NAD.   Neuro:  AAOx4, speech clear, MAE  HEENT:  Mm moist, +JVD  Cardiovascular:  s1s2 rrr Lungs:  resps even non labored on high flow [place], lungs bilaterally diminished, few faint crackles lower posterior Abdomen:  Soft, nt, +bs Musculoskeletal:  Warm and dry, 2+ pitting edema BLE    Recent Labs Lab 05/14/13 0700 05/15/13 0500 05/16/13 0500  NA 145 146 144  K 3.9 3.8 3.8  CL 101 103 103  CO2 33* 31 30  BUN 44* 43* 38*  CREATININE 1.35 1.31 1.24  GLUCOSE 132* 132* 122*    Recent Labs Lab 05/15/13 0330 05/16/13 0500 05/16/13 0830  HGB 9.6* 7.7* 8.3*  HCT 32.7* 24.8* 26.2*  WBC 11.0* 17.5* 18.5*  PLT 684* 339 390   Dg Chest Port 1 View  05/16/2013   CLINICAL DATA:  f/u PNA  EXAM: PORTABLE CHEST - 1 VIEW  COMPARISON:  DG CHEST 1V PORT dated 05/15/2013  FINDINGS: Low lung volumes. Cardiac silhouette is enlarged. Patient is status post median sternotomy and coronary artery bypass grafting. Right-sided PICC  line tip superior vena cava. Stable bibasilar opacities with sparing of the lung apices. Decreased size of the left pleural effusion. Degenerative changes in the right and left shoulders.  IMPRESSION: No change in the bibasilar opacities.  Decreased left pleural effusion.   Electronically Signed   By: Margaree Mackintosh M.D.   On: 05/16/2013 08:15   Dg Chest Port 1 View  05/15/2013   CLINICAL DATA:  Pneumonia  EXAM: PORTABLE CHEST - 1 VIEW  COMPARISON:  05/14/2013  FINDINGS: Right-sided PICC line tip terminates over the distal SVC. Evidence of CABG. Predominantly bilateral lower lobe patchy airspace consolidation is reidentified, left greater than right, with associated left basilar pleural effusion. Aeration is slightly decreased at the lung bases versus subtle increase in opacification.  IMPRESSION: Persistent bilateral predominantly lower lobe parenchymal airspace opacities, increased at the lung bases versus more apparent due to hypoaeration, for which differential considerations remain as follows: Pneumonia, other alveolar filling processes such as edema or aspiration, less likely hemorrhage.  Left pleural effusion reidentified.   Electronically Signed   By: Conchita Paris M.D.   On: 05/15/2013 18:51    ASSESSMENT / PLAN:  Acute on chronic respiratory failure - Unclear infiltrates ( infectious (asp chonic) vs NON infectious, amio?) multifactorial in setting COPD, pulm HTN, presumed recurrent aspiration PNA and volume overload.  Amiodarone toxicity ? COPD  Recurrent PNA - presumed aspiration  Pulmonary Nodule - RUL,  was known to Dr. Koleen Nimrod in [place] Pulmonary Hypertension - ECHO PA peak 61  PLAN -  -Esr, anca, anti gbm, c3, c4, ana, dsdna, HIV>> negative -Cont O2 high flow, with pulm htn avoid sats less 92% -Speech eval and objective swallow test >> pt does not want to follow the diet, may explain recurrent pna -abx per primary  -F/u CXR -Diuresis as BP and Scr tolerate, lasix gtt per  primary -Hold amiodarone in setting bilat pulm infiltrates and sig hypoxia  -no role for steroids -would recommend no intubation, discussed with patient & family.  Continue ongoing discussion re: intubation & goals of care. Likely aspiration playing a large role here and pt does not want to follow the prescribed diet. -obtain previous CT results from [facility] for nodule comparison      Noe Gens, NP-C [place] Pgr: [phone] or [phone]

## 2013-05-17 LAB — BLOOD GAS, ARTERIAL
Acid-Base Excess: 7 mmol/L — ABNORMAL HIGH (ref 0.0–2.0)
BICARBONATE: 30.6 meq/L — AB (ref 20.0–24.0)
FIO2: 0.65 %
O2 SAT: 98.8 %
PATIENT TEMPERATURE: 98.6
PH ART: 7.493 — AB (ref 7.350–7.450)
PO2 ART: 73.3 mmHg — AB (ref 80.0–100.0)
TCO2: 31.8 mmol/L (ref 0–100)
pCO2 arterial: 40.2 mmHg (ref 35.0–45.0)

## 2013-05-17 LAB — CULTURE, RESPIRATORY W GRAM STAIN: Culture: NORMAL

## 2013-05-17 LAB — BASIC METABOLIC PANEL
BUN: 47 mg/dL — AB (ref 6–23)
CO2: 31 meq/L (ref 19–32)
CREATININE: 1.45 mg/dL — AB (ref 0.50–1.35)
Calcium: 8.1 mg/dL — ABNORMAL LOW (ref 8.4–10.5)
Chloride: 103 mEq/L (ref 96–112)
GFR calc Af Amer: 51 mL/min — ABNORMAL LOW (ref >90)
GFR calc non Af Amer: 44 mL/min — ABNORMAL LOW (ref >90)
GLUCOSE: 117 mg/dL — AB (ref 70–99)
Potassium: 4.1 mEq/L (ref 3.7–5.3)
Sodium: 145 mEq/L (ref 137–147)

## 2013-05-17 LAB — PREPARE RBC (CROSSMATCH)

## 2013-05-17 LAB — CBC WITH DIFFERENTIAL/PLATELET
Basophils Absolute: 0.1 10*3/uL (ref 0.0–0.1)
Basophils Relative: 1 % (ref 0–1)
EOS ABS: 0.3 10*3/uL (ref 0.0–0.7)
EOS PCT: 2 % (ref 0–5)
HEMATOCRIT: 21.7 % — AB (ref 39.0–52.0)
Hemoglobin: 6.9 g/dL — CL (ref 13.0–17.0)
Lymphocytes Relative: 11 % — ABNORMAL LOW (ref 12–46)
Lymphs Abs: 1.6 10*3/uL (ref 0.7–4.0)
MCH: 29.5 pg (ref 26.0–34.0)
MCHC: 31.8 g/dL (ref 30.0–36.0)
MCV: 92.7 fL (ref 78.0–100.0)
MONOS PCT: 9 % (ref 3–12)
Monocytes Absolute: 1.3 10*3/uL — ABNORMAL HIGH (ref 0.1–1.0)
NEUTROS ABS: 11 10*3/uL — AB (ref 1.7–7.7)
NEUTROS PCT: 77 % (ref 43–77)
Platelets: 348 10*3/uL (ref 150–400)
RBC: 2.34 MIL/uL — AB (ref 4.22–5.81)
RDW: 17.6 % — ABNORMAL HIGH (ref 11.5–15.5)
WBC: 14.3 10*3/uL — AB (ref 4.0–10.5)

## 2013-05-17 LAB — CULTURE, RESPIRATORY

## 2013-05-17 LAB — PHOSPHORUS: Phosphorus: 3.7 mg/dL (ref 2.3–4.6)

## 2013-05-17 LAB — MAGNESIUM: Magnesium: 2.4 mg/dL (ref 1.5–2.5)

## 2013-05-18 ENCOUNTER — Other Ambulatory Visit (HOSPITAL_COMMUNITY): Payer: Medicare Other

## 2013-05-18 LAB — CBC WITH DIFFERENTIAL/PLATELET
Basophils Absolute: 0.1 10*3/uL (ref 0.0–0.1)
Basophils Relative: 1 % (ref 0–1)
Eosinophils Absolute: 0.4 10*3/uL (ref 0.0–0.7)
HCT: 26.3 % — ABNORMAL LOW (ref 39.0–52.0)
HEMOGLOBIN: 8.6 g/dL — AB (ref 13.0–17.0)
LYMPHS ABS: 1.4 10*3/uL (ref 0.7–4.0)
Lymphocytes Relative: 11 % — ABNORMAL LOW (ref 12–46)
MCH: 29.9 pg (ref 26.0–34.0)
MCHC: 32.7 g/dL (ref 30.0–36.0)
MCV: 91.3 fL (ref 78.0–100.0)
Monocytes Absolute: 1.3 10*3/uL — ABNORMAL HIGH (ref 0.1–1.0)
NEUTROS ABS: 10 10*3/uL — AB (ref 1.7–7.7)
Neutrophils Relative %: 75 % (ref 43–77)
Platelets: 368 10*3/uL (ref 150–400)
RBC: 2.88 MIL/uL — ABNORMAL LOW (ref 4.22–5.81)
RDW: 17.6 % — ABNORMAL HIGH (ref 11.5–15.5)
WBC: 13.3 10*3/uL — ABNORMAL HIGH (ref 4.0–10.5)

## 2013-05-18 LAB — OCCULT BLOOD X 1 CARD TO LAB, STOOL: Fecal Occult Bld: POSITIVE — AB

## 2013-05-18 LAB — TYPE AND SCREEN
ABO/RH(D): A POS
Antibody Screen: NEGATIVE
UNIT DIVISION: 0
Unit division: 0

## 2013-05-18 LAB — BASIC METABOLIC PANEL
BUN: 43 mg/dL — ABNORMAL HIGH (ref 6–23)
CHLORIDE: 106 meq/L (ref 96–112)
CO2: 30 meq/L (ref 19–32)
Calcium: 8.2 mg/dL — ABNORMAL LOW (ref 8.4–10.5)
Creatinine, Ser: 1.3 mg/dL (ref 0.50–1.35)
GFR calc Af Amer: 59 mL/min — ABNORMAL LOW (ref >90)
GFR calc non Af Amer: 51 mL/min — ABNORMAL LOW (ref >90)
GLUCOSE: 118 mg/dL — AB (ref 70–99)
Potassium: 4.3 mEq/L (ref 3.7–5.3)
Sodium: 147 mEq/L (ref 137–147)

## 2013-05-18 LAB — PHOSPHORUS: Phosphorus: 3 mg/dL (ref 2.3–4.6)

## 2013-05-18 LAB — MAGNESIUM: MAGNESIUM: 2.6 mg/dL — AB (ref 1.5–2.5)

## 2013-05-19 ENCOUNTER — Other Ambulatory Visit (HOSPITAL_COMMUNITY): Payer: Medicare Other

## 2013-05-19 DIAGNOSIS — J438 Other emphysema: Secondary | ICD-10-CM

## 2013-05-19 LAB — PROTIME-INR
INR: 1.12 (ref 0.00–1.49)
Prothrombin Time: 14.2 seconds (ref 11.6–15.2)

## 2013-05-19 LAB — BASIC METABOLIC PANEL
BUN: 32 mg/dL — ABNORMAL HIGH (ref 6–23)
CHLORIDE: 107 meq/L (ref 96–112)
CO2: 31 meq/L (ref 19–32)
CREATININE: 1.1 mg/dL (ref 0.50–1.35)
Calcium: 8.4 mg/dL (ref 8.4–10.5)
GFR calc Af Amer: 72 mL/min — ABNORMAL LOW (ref >90)
GFR calc non Af Amer: 62 mL/min — ABNORMAL LOW (ref >90)
GLUCOSE: 119 mg/dL — AB (ref 70–99)
Potassium: 4.4 mEq/L (ref 3.7–5.3)
Sodium: 150 mEq/L — ABNORMAL HIGH (ref 137–147)

## 2013-05-19 LAB — CBC
HCT: 29.2 % — ABNORMAL LOW (ref 39.0–52.0)
HEMOGLOBIN: 9.1 g/dL — AB (ref 13.0–17.0)
MCH: 29.2 pg (ref 26.0–34.0)
MCHC: 31.2 g/dL (ref 30.0–36.0)
MCV: 93.6 fL (ref 78.0–100.0)
PLATELETS: 403 10*3/uL — AB (ref 150–400)
RBC: 3.12 MIL/uL — AB (ref 4.22–5.81)
RDW: 17.6 % — ABNORMAL HIGH (ref 11.5–15.5)
WBC: 14.2 10*3/uL — ABNORMAL HIGH (ref 4.0–10.5)

## 2013-05-19 LAB — BLOOD GAS, ARTERIAL
Acid-Base Excess: 6.6 mmol/L — ABNORMAL HIGH (ref 0.0–2.0)
Bicarbonate: 30.2 mEq/L — ABNORMAL HIGH (ref 20.0–24.0)
FIO2: 0.65 %
O2 Saturation: 96.7 %
PO2 ART: 79.6 mmHg — AB (ref 80.0–100.0)
Patient temperature: 98.6
TCO2: 31.4 mmol/L (ref 0–100)
pCO2 arterial: 39.4 mmHg (ref 35.0–45.0)
pH, Arterial: 7.496 — ABNORMAL HIGH (ref 7.350–7.450)

## 2013-05-19 LAB — PHOSPHORUS: Phosphorus: 3.2 mg/dL (ref 2.3–4.6)

## 2013-05-19 LAB — MAGNESIUM: Magnesium: 2.6 mg/dL — ABNORMAL HIGH (ref 1.5–2.5)

## 2013-05-19 LAB — APTT: APTT: 33 s (ref 24–37)

## 2013-05-19 NOTE — Progress Notes (Signed)
Name: Scott Mejia MRN: [id number] DOB: 07-21-1931    ADMISSION DATE:  05/09/2013 CONSULTATION DATE:  05/11/13  REFERRING MD :  Laren Everts PRIMARY SERVICE:  [facility]  CHIEF COMPLAINT:  Recurrent PNA, hypoxia   BRIEF PATIENT DESCRIPTION: 80 y/o male with hx COPD on home O2, Afib on xarelto, pulmonary HTN [facility] after admission for HCAP.  Has had recurrent PNA (5 hospitalization for PNA in last 1 year) with suspicion for recurrent aspiration.     SIGNIFICANT EVENTS / STUDIES:   2D echo [facility]) >>>"EF 60%,  Elevated pulmonary pressures" VQ scan ([place])>>> low probability PE  ............................................................... 4/10 - 40LPM high flow O2, sats 90%, no distress, on lasix gtt 4/10 - ECHO >> EF 60-65%, nml wall motion, PA peak 61 4/13 - O2 reduced to 30 LPM, 65% high flow O2 4/15  remains on 65 % fio2  CULTURES:   ANTIBIOTICS: Per ssh   VITAL SIGNS: Vital signs reviewed. Abnormal values will appear under impression plan section.    PHYSICAL EXAMINATION: General:  Chronically ill appearing male, NAD.  Sitting on side of bed nad Neuro:  AAOx4, speech clear, MAE  HEENT:  Mm moist, +JVD  Cardiovascular:  s1s2 rrr Lungs:  resps even non labored on high flow [place](65%), lungs bilaterally diminished, few faint crackles lower posterior Abdomen:  Soft, nt, +bs Musculoskeletal:  Warm and dry, 2+ pitting edema BLE (decreased 4/15)   Recent Labs Lab 05/17/13 0500 05/18/13 0630 05/19/13 0637  NA 145 147 150*  K 4.1 4.3 4.4  CL 103 106 107  CO2 31 30 31   BUN 47* 43* 32*  CREATININE 1.45* 1.30 1.10  GLUCOSE 117* 118* 119*    Recent Labs Lab 05/17/13 0500 05/18/13 0630 05/19/13 0637  HGB 6.9* 8.6* 9.1*  HCT 21.7* 26.3* 29.2*  WBC 14.3* 13.3* 14.2*  PLT 348 368 403*   Dg Chest Port 1 View  05/19/2013   CLINICAL DATA:  Difficulty breathing  EXAM: PORTABLE CHEST - 1 VIEW  COMPARISON:  May 18, 2013  FINDINGS: There is  underlying emphysema. There is interstitial and patchy alveolar edema in the mid and lower lung zones, stable from 1 day prior. No new opacity. Heart is enlarged. The pulmonary vascularity reflects underlying emphysematous change. Central catheter tip is in the superior cava. No pneumothorax. No adenopathy. Patient is status post coronary artery bypass grafting.  IMPRESSION: Evidence of congestive heart failure superimposed on emphysematous change, stable from 1 day prior. No new opacity.   Electronically Signed   By: Lowella Grip M.D.   On: 05/19/2013 08:14   Dg Chest Port 1 View  05/18/2013   CLINICAL DATA:  Hypoxia  EXAM: PORTABLE CHEST - 1 VIEW  COMPARISON:  May 16, 2013  FINDINGS: There is underlying emphysematous change. There is extensive interstitial and patchy alveolar edema bilaterally. Heart is enlarged. The pulmonary vascularity reflects congestive heart failure superimposed on emphysematous change. No adenopathy. Patient is status post coronary artery bypass grafting. Central catheter tip is in the superior vena cava no pneumothorax.  IMPRESSION: Congestive heart failure superimposed on emphysematous change. No pneumothorax.   Electronically Signed   By: Lowella Grip M.D.   On: 05/18/2013 07:51    ASSESSMENT / PLAN:  Acute on chronic respiratory failure - Unclear infiltrates ( infectious (asp chonic) vs NON infectious, amio?) multifactorial in setting COPD, pulm HTN, presumed recurrent aspiration PNA and volume overload.  Amiodarone toxicity ? COPD  Recurrent PNA - presumed aspiration (  sputum nl flora 4-11) Pulmonary Nodule - RUL, was known to Dr. Koleen Nimrod in [place] Pulmonary Hypertension - ECHO PA peak 61  PLAN -  -Esr(55), (anca, anti gbm, c3, c4, ana, dsdna, HIV)>> all negative -Cont O2 high flow, with pulm htn avoid sats less 92% -Speech eval and objective swallow test >> pt does not want to follow the diet, may explain recurrent pna -abx per primary  -F/u  CXR -Diuresis as BP and Scr tolerate, lasix gtt per primary -Hold amiodarone in setting bilat pulm infiltrates and sig hypoxia  -no role for steroids -would recommend no intubation, discussed with patient & family.  Continue ongoing discussion re: intubation & goals of care. Likely aspiration playing a large role here and pt does not want to follow the prescribed diet. -obtain previous CT results from [facility] for nodule comparison      [facility] Minor ACNP [place] Pager [phone] till 3 pm If no answer page [phone] 05/19/2013, 8:43 AM    Merton Border, MD ; [facility] service Mobile [phone].  After 5:30 PM or weekends, call [phone]

## 2013-05-20 LAB — BASIC METABOLIC PANEL
BUN: 32 mg/dL — ABNORMAL HIGH (ref 6–23)
CALCIUM: 8.8 mg/dL (ref 8.4–10.5)
CO2: 30 meq/L (ref 19–32)
CREATININE: 1.23 mg/dL (ref 0.50–1.35)
Chloride: 108 mEq/L (ref 96–112)
GFR calc Af Amer: 63 mL/min — ABNORMAL LOW (ref >90)
GFR, EST NON AFRICAN AMERICAN: 54 mL/min — AB (ref >90)
GLUCOSE: 121 mg/dL — AB (ref 70–99)
Potassium: 4.6 mEq/L (ref 3.7–5.3)
Sodium: 150 mEq/L — ABNORMAL HIGH (ref 137–147)

## 2013-05-20 LAB — PHOSPHORUS: Phosphorus: 3.9 mg/dL (ref 2.3–4.6)

## 2013-05-20 LAB — CBC WITH DIFFERENTIAL/PLATELET
Basophils Absolute: 0 10*3/uL (ref 0.0–0.1)
Basophils Relative: 0 % (ref 0–1)
EOS ABS: 0.6 10*3/uL (ref 0.0–0.7)
EOS PCT: 4 % (ref 0–5)
HCT: 31.7 % — ABNORMAL LOW (ref 39.0–52.0)
HEMOGLOBIN: 9.5 g/dL — AB (ref 13.0–17.0)
LYMPHS PCT: 11 % — AB (ref 12–46)
Lymphs Abs: 1.5 10*3/uL (ref 0.7–4.0)
MCH: 28.6 pg (ref 26.0–34.0)
MCHC: 30 g/dL (ref 30.0–36.0)
MCV: 95.5 fL (ref 78.0–100.0)
MONO ABS: 1.2 10*3/uL — AB (ref 0.1–1.0)
Monocytes Relative: 8 % (ref 3–12)
Neutro Abs: 11 10*3/uL — ABNORMAL HIGH (ref 1.7–7.7)
Neutrophils Relative %: 77 % (ref 43–77)
Platelets: 488 10*3/uL — ABNORMAL HIGH (ref 150–400)
RBC: 3.32 MIL/uL — ABNORMAL LOW (ref 4.22–5.81)
RDW: 18 % — ABNORMAL HIGH (ref 11.5–15.5)
WBC: 14.3 10*3/uL — ABNORMAL HIGH (ref 4.0–10.5)

## 2013-05-20 LAB — MAGNESIUM: Magnesium: 2.6 mg/dL — ABNORMAL HIGH (ref 1.5–2.5)

## 2013-05-20 LAB — PRO B NATRIURETIC PEPTIDE: Pro B Natriuretic peptide (BNP): 225.6 pg/mL (ref 0–450)

## 2013-05-21 LAB — RENAL FUNCTION PANEL
Albumin: 2.5 g/dL — ABNORMAL LOW (ref 3.5–5.2)
BUN: 37 mg/dL — ABNORMAL HIGH (ref 6–23)
CALCIUM: 8.7 mg/dL (ref 8.4–10.5)
CO2: 31 meq/L (ref 19–32)
Chloride: 106 mEq/L (ref 96–112)
Creatinine, Ser: 1.34 mg/dL (ref 0.50–1.35)
GFR calc Af Amer: 56 mL/min — ABNORMAL LOW (ref >90)
GFR, EST NON AFRICAN AMERICAN: 49 mL/min — AB (ref >90)
Glucose, Bld: 114 mg/dL — ABNORMAL HIGH (ref 70–99)
Phosphorus: 3.6 mg/dL (ref 2.3–4.6)
Potassium: 4.7 mEq/L (ref 3.7–5.3)
SODIUM: 147 meq/L (ref 137–147)

## 2013-05-21 LAB — HEMOGLOBIN AND HEMATOCRIT, BLOOD
HEMATOCRIT: 30.4 % — AB (ref 39.0–52.0)
Hemoglobin: 9.2 g/dL — ABNORMAL LOW (ref 13.0–17.0)

## 2013-05-22 LAB — BASIC METABOLIC PANEL
BUN: 36 mg/dL — ABNORMAL HIGH (ref 6–23)
CO2: 30 mEq/L (ref 19–32)
CREATININE: 1.28 mg/dL (ref 0.50–1.35)
Calcium: 8.6 mg/dL (ref 8.4–10.5)
Chloride: 107 mEq/L (ref 96–112)
GFR calc non Af Amer: 52 mL/min — ABNORMAL LOW (ref >90)
GFR, EST AFRICAN AMERICAN: 60 mL/min — AB (ref >90)
Glucose, Bld: 116 mg/dL — ABNORMAL HIGH (ref 70–99)
POTASSIUM: 4.4 meq/L (ref 3.7–5.3)
Sodium: 147 mEq/L (ref 137–147)

## 2013-05-23 LAB — CBC WITH DIFFERENTIAL/PLATELET
BASOS ABS: 0.1 10*3/uL (ref 0.0–0.1)
Basophils Relative: 1 % (ref 0–1)
Eosinophils Absolute: 0.3 10*3/uL (ref 0.0–0.7)
Eosinophils Relative: 3 % (ref 0–5)
HCT: 30.8 % — ABNORMAL LOW (ref 39.0–52.0)
Hemoglobin: 9.4 g/dL — ABNORMAL LOW (ref 13.0–17.0)
LYMPHS ABS: 1.6 10*3/uL (ref 0.7–4.0)
LYMPHS PCT: 13 % (ref 12–46)
MCH: 28.8 pg (ref 26.0–34.0)
MCHC: 30.5 g/dL (ref 30.0–36.0)
MCV: 94.5 fL (ref 78.0–100.0)
MONO ABS: 1.2 10*3/uL — AB (ref 0.1–1.0)
Monocytes Relative: 9 % (ref 3–12)
NEUTROS ABS: 9.5 10*3/uL — AB (ref 1.7–7.7)
Neutrophils Relative %: 74 % (ref 43–77)
Platelets: 485 10*3/uL — ABNORMAL HIGH (ref 150–400)
RBC: 3.26 MIL/uL — ABNORMAL LOW (ref 4.22–5.81)
RDW: 16.6 % — AB (ref 11.5–15.5)
WBC: 12.6 10*3/uL — AB (ref 4.0–10.5)

## 2013-05-23 LAB — PREALBUMIN: Prealbumin: 15.7 mg/dL — ABNORMAL LOW (ref 17.0–34.0)

## 2013-05-23 LAB — COMPREHENSIVE METABOLIC PANEL
ALT: 14 U/L (ref 0–53)
AST: 24 U/L (ref 0–37)
Albumin: 2.5 g/dL — ABNORMAL LOW (ref 3.5–5.2)
Alkaline Phosphatase: 96 U/L (ref 39–117)
BILIRUBIN TOTAL: 1.4 mg/dL — AB (ref 0.3–1.2)
BUN: 32 mg/dL — ABNORMAL HIGH (ref 6–23)
CHLORIDE: 106 meq/L (ref 96–112)
CO2: 28 meq/L (ref 19–32)
CREATININE: 1.34 mg/dL (ref 0.50–1.35)
Calcium: 8.6 mg/dL (ref 8.4–10.5)
GFR calc Af Amer: 56 mL/min — ABNORMAL LOW (ref >90)
GFR, EST NON AFRICAN AMERICAN: 49 mL/min — AB (ref >90)
GLUCOSE: 100 mg/dL — AB (ref 70–99)
Potassium: 4.4 mEq/L (ref 3.7–5.3)
SODIUM: 147 meq/L (ref 137–147)
Total Protein: 6.7 g/dL (ref 6.0–8.3)

## 2013-05-23 NOTE — Progress Notes (Signed)
   Name: Scott Mejia MRN: [id number] DOB: [birth year]    ADMISSION DATE:  05/09/2013 CONSULTATION DATE:  05/11/13  REFERRING MD :  Sharyon MedicusHijazi [facility]IMARY SERVICE:  [facility]  CHIEF COMPLAINT:  Recurrent PNA, hypoxia   BRIEF PATIENT DESCRIPTION: 80 y/o male with hx COPD on home O2 (4 liters), Afib on xarelto, pulmonary HTN [facility]  Has had recurrent PNA (5 hospitalization for PNA in last 1 year) with suspicion for recurrent aspiration.     SIGNIFICANT EVENTS / STUDIES:   2D echo [facility]([place]) >>>"EF 60%,  Elevated pulmonary pressures" VQ scan ([place])>>> low probability PE  ............................................................... 4/10 40LPM high flow O2, sats 90%, no distress, on lasix gtt 4/10 ECHO >> EF 60-65%, nml wall motion, PA peak 61 4/13 O2 reduced to 30 LPM, 65% high flow O2 4/15 remains on 65 % fio2 4/20 fio2 down to  to 40%  SUBJECTIVE: Denies chest pain, sputum, or wheeze  VITAL SIGNS: Vital signs reviewed. Abnormal values will appear under impression plan section.    PHYSICAL EXAMINATION: General:  Chronically ill appearing male, NAD.  Sitting on side of bed nad Neuro:  AAOx4, speech clear, MAE  HEENT:  Mm moist, +JVD  Cardiovascular:  s1s2 rrr Lungs:  resps even non labored on high flow Scott Mejia(40%), lungs bilaterally diminished,  Abdomen:  Soft, nt, +bs Musculoskeletal:  Warm and dry, 2+ pitting edema BLE (decreased 4/15)   Recent Labs Lab 05/21/13 0600 05/22/13 0600 05/23/13 0500  NA 147 147 147  K 4.7 4.4 4.4  CL 106 107 106  CO2 31 30 28   BUN 37* 36* 32*  CREATININE 1.34 1.28 1.34  GLUCOSE 114* 116* 100*    Recent Labs Lab 05/19/13 0637 05/20/13 0730 05/21/13 0600 05/23/13 0500  HGB 9.1* 9.5* 9.2* 9.4*  HCT 29.2* 31.7* 30.4* 30.8*  WBC 14.2* 14.3*  --  12.6*  PLT 403* 488*  --  485*   No results found.  ASSESSMENT:  Acute on chronic respiratory failure 2nd to COPD/emphysema and basilar  predominate fibrosis (?from chronic aspiration vs inflammatory; serology from 05/11/13 negative). 16 mm nodule RUL. Secondary pulmonary HTN.  PLAN:  -adjust oxygen to keep SpO2 > 88% -speech therapy to work on swallowing issues -simplify BD regimen >> he does not need anoro, pulmicort nebulizer, performist, and scheduled duoneb >> will continue anoro, add flovent, and change albuterol to prn -defer assessment of RUL nodule for now -negative fluid balance as tolerated per primary team  Scott Mejia ACNP Scott Mejia PCCM Pager [phone] till 3 pm If no answer page [phone] 05/23/2013, 9:31 AM  Updated wife at bedside about plan.  Scott HellingVineet Aster Eckrich, MD [facility] 05/23/2013, 11:05 AM Pager:  [phone] After 3pm call: [phone]

## 2013-05-26 ENCOUNTER — Other Ambulatory Visit (HOSPITAL_COMMUNITY): Payer: Medicare Other

## 2013-05-26 LAB — BASIC METABOLIC PANEL WITH GFR
BUN: 40 mg/dL — ABNORMAL HIGH (ref 6–23)
CO2: 28 meq/L (ref 19–32)
Calcium: 8.7 mg/dL (ref 8.4–10.5)
Chloride: 104 meq/L (ref 96–112)
Creatinine, Ser: 1.7 mg/dL — ABNORMAL HIGH (ref 0.50–1.35)
GFR calc Af Amer: 42 mL/min — ABNORMAL LOW
GFR calc non Af Amer: 37 mL/min — ABNORMAL LOW
Glucose, Bld: 104 mg/dL — ABNORMAL HIGH (ref 70–99)
Potassium: 4.2 meq/L (ref 3.7–5.3)
Sodium: 145 meq/L (ref 137–147)

## 2013-05-26 LAB — CBC
HCT: 31 % — ABNORMAL LOW (ref 39.0–52.0)
Hemoglobin: 9.8 g/dL — ABNORMAL LOW (ref 13.0–17.0)
MCH: 28.9 pg (ref 26.0–34.0)
MCHC: 31.6 g/dL (ref 30.0–36.0)
MCV: 91.4 fL (ref 78.0–100.0)
PLATELETS: 445 10*3/uL — AB (ref 150–400)
RBC: 3.39 MIL/uL — ABNORMAL LOW (ref 4.22–5.81)
RDW: 15.9 % — AB (ref 11.5–15.5)
WBC: 12.5 10*3/uL — ABNORMAL HIGH (ref 4.0–10.5)

## 2013-05-26 NOTE — Progress Notes (Signed)
   Name: Scott CritchleyFred C Mejia MRN: [id number] DOB: Jun 26, 1931    ADMISSION DATE:  05/09/2013 CONSULTATION DATE:  05/11/13  REFERRING MD :  Sharyon MedicusHijazi [facility]IMARY [facility]ICE:  [facility]  CHIEF COMPLAINT:  Recurrent PNA, hypoxia   BRIEF PATIENT DESCRIPTION: 80 y/o male with hx COPD on home O2 (4 liters), Afib on xarelto, pulmonary HTN [facility]  Has had recurrent PNA (5 hospitalization for PNA in last 1 year) with suspicion for recurrent aspiration.     SIGNIFICANT EVENTS / STUDIES:   2D echo [facility]([place]) >>>"EF 60%,  Elevated pulmonary pressures" VQ scan ([place])>>> low probability PE  ............................................................... 4/10 40LPM high flow O2, sats 90%, no distress, on lasix gtt 4/10 ECHO >> EF 60-65%, nml wall motion, PA peak 61 4/13 O2 reduced to 30 LPM, 65% high flow O2 4/15 remains on 65 % fio2 4/20 fio2 down to  to 40% 4/23 remains on 40% fio2  SUBJECTIVE: NAD at rest  VITAL SIGNS: Vital signs reviewed. Abnormal values will appear under impression plan section.    PHYSICAL EXAMINATION: General:  Chronically ill appearing male, NAD.  Sleeping till aroused, sats 91% while asleep Neuro:  AAOx4, speech clear, MAE  HEENT:  Mm moist, +JVD  Cardiovascular:  s1s2 rrr Lungs:  resps even non labored on high flow [place](40%), lungs bilaterally diminished,  Abdomen:  Soft, nt, +bs Musculoskeletal:  Warm and dry, 2+ pitting edema BLE mild   Recent Labs Lab 05/22/13 0600 05/23/13 0500 05/26/13 0540  NA 147 147 145  K 4.4 4.4 4.2  CL 107 106 104  CO2 30 28 28   BUN 36* 32* 40*  CREATININE 1.28 1.34 1.70*  GLUCOSE 116* 100* 104*    Recent Labs Lab 05/20/13 0730 05/21/13 0600 05/23/13 0500 05/26/13 0540  HGB 9.5* 9.2* 9.4* 9.8*  HCT 31.7* 30.4* 30.8* 31.0*  WBC 14.3*  --  12.6* 12.5*  PLT 488*  --  485* 445*   No results found.  ASSESSMENT:  Acute on chronic respiratory failure 2nd to COPD/emphysema and  basilar predominate fibrosis (?from chronic aspiration vs inflammatory; serology from 05/11/13 negative). 16 mm nodule RUL. Secondary pulmonary HTN.  PLAN:  -adjust oxygen to keep SpO2 > 88% >>consider trying oximyzer -speech therapy to work on swallowing issues -continue anoro, flovent, and  albuterol  prn -defer assessment of RUL nodule for now -negative fluid balance as tolerated per primary team(note bun/creatine rise 4/23)  Brett CanalesSteve Minor ACNP [facility] P[facility] Pager [phone] till 3 pm If no answer page [phone] 05/26/2013, 8:50 AM  Reviewed above, and examined.  Coralyn HellingVineet Derrico Zhong, MD [facility] 05/26/2013, 10:12 AM Pager:  [phone] After 3pm call: [phone]

## 2013-05-27 LAB — BASIC METABOLIC PANEL
BUN: 38 mg/dL — ABNORMAL HIGH (ref 6–23)
CALCIUM: 8.5 mg/dL (ref 8.4–10.5)
CO2: 29 mEq/L (ref 19–32)
Chloride: 105 mEq/L (ref 96–112)
Creatinine, Ser: 1.72 mg/dL — ABNORMAL HIGH (ref 0.50–1.35)
GFR calc Af Amer: 42 mL/min — ABNORMAL LOW (ref >90)
GFR calc non Af Amer: 36 mL/min — ABNORMAL LOW (ref >90)
GLUCOSE: 104 mg/dL — AB (ref 70–99)
Potassium: 4.6 mEq/L (ref 3.7–5.3)
SODIUM: 145 meq/L (ref 137–147)

## 2013-05-28 ENCOUNTER — Other Ambulatory Visit (HOSPITAL_COMMUNITY): Payer: Medicare Other

## 2013-05-28 LAB — CBC WITH DIFFERENTIAL/PLATELET
Basophils Absolute: 0 10*3/uL (ref 0.0–0.1)
Basophils Relative: 0 % (ref 0–1)
EOS ABS: 0 10*3/uL (ref 0.0–0.7)
Eosinophils Relative: 0 % (ref 0–5)
HCT: 31.1 % — ABNORMAL LOW (ref 39.0–52.0)
Hemoglobin: 9.7 g/dL — ABNORMAL LOW (ref 13.0–17.0)
LYMPHS ABS: 1.6 10*3/uL (ref 0.7–4.0)
Lymphocytes Relative: 16 % (ref 12–46)
MCH: 28.7 pg (ref 26.0–34.0)
MCHC: 31.2 g/dL (ref 30.0–36.0)
MCV: 92 fL (ref 78.0–100.0)
Monocytes Absolute: 0.8 10*3/uL (ref 0.1–1.0)
Monocytes Relative: 8 % (ref 3–12)
Neutro Abs: 7.6 10*3/uL (ref 1.7–7.7)
Neutrophils Relative %: 76 % (ref 43–77)
PLATELETS: 360 10*3/uL (ref 150–400)
RBC: 3.38 MIL/uL — AB (ref 4.22–5.81)
RDW: 15.7 % — ABNORMAL HIGH (ref 11.5–15.5)
WBC: 10.1 10*3/uL (ref 4.0–10.5)

## 2013-05-28 LAB — BASIC METABOLIC PANEL
BUN: 27 mg/dL — ABNORMAL HIGH (ref 6–23)
CO2: 26 mEq/L (ref 19–32)
Calcium: 8.8 mg/dL (ref 8.4–10.5)
Chloride: 105 mEq/L (ref 96–112)
Creatinine, Ser: 1.34 mg/dL (ref 0.50–1.35)
GFR, EST AFRICAN AMERICAN: 56 mL/min — AB (ref >90)
GFR, EST NON AFRICAN AMERICAN: 49 mL/min — AB (ref >90)
GLUCOSE: 117 mg/dL — AB (ref 70–99)
POTASSIUM: 4.3 meq/L (ref 3.7–5.3)
SODIUM: 145 meq/L (ref 137–147)

## 2013-05-28 LAB — MAGNESIUM: MAGNESIUM: 2.1 mg/dL (ref 1.5–2.5)

## 2013-05-28 LAB — PHOSPHORUS: PHOSPHORUS: 3.9 mg/dL (ref 2.3–4.6)

## 2013-05-30 ENCOUNTER — Other Ambulatory Visit (HOSPITAL_COMMUNITY): Payer: Medicare Other

## 2013-05-30 LAB — BASIC METABOLIC PANEL
BUN: 24 mg/dL — AB (ref 6–23)
CO2: 25 meq/L (ref 19–32)
Calcium: 8.7 mg/dL (ref 8.4–10.5)
Chloride: 105 mEq/L (ref 96–112)
Creatinine, Ser: 1.61 mg/dL — ABNORMAL HIGH (ref 0.50–1.35)
GFR, EST AFRICAN AMERICAN: 45 mL/min — AB (ref >90)
GFR, EST NON AFRICAN AMERICAN: 39 mL/min — AB (ref >90)
GLUCOSE: 118 mg/dL — AB (ref 70–99)
POTASSIUM: 4.3 meq/L (ref 3.7–5.3)
SODIUM: 146 meq/L (ref 137–147)

## 2013-05-30 LAB — CBC WITH DIFFERENTIAL/PLATELET
Basophils Absolute: 0 10*3/uL (ref 0.0–0.1)
Basophils Relative: 0 % (ref 0–1)
EOS PCT: 0 % (ref 0–5)
Eosinophils Absolute: 0 10*3/uL (ref 0.0–0.7)
HCT: 35.5 % — ABNORMAL LOW (ref 39.0–52.0)
Hemoglobin: 10.7 g/dL — ABNORMAL LOW (ref 13.0–17.0)
LYMPHS ABS: 1.5 10*3/uL (ref 0.7–4.0)
LYMPHS PCT: 12 % (ref 12–46)
MCH: 28.2 pg (ref 26.0–34.0)
MCHC: 30.1 g/dL (ref 30.0–36.0)
MCV: 93.7 fL (ref 78.0–100.0)
Monocytes Absolute: 1.5 10*3/uL — ABNORMAL HIGH (ref 0.1–1.0)
Monocytes Relative: 12 % (ref 3–12)
NEUTROS PCT: 76 % (ref 43–77)
Neutro Abs: 9.3 10*3/uL — ABNORMAL HIGH (ref 1.7–7.7)
Platelets: 369 10*3/uL (ref 150–400)
RBC: 3.79 MIL/uL — AB (ref 4.22–5.81)
RDW: 15.7 % — ABNORMAL HIGH (ref 11.5–15.5)
WBC: 12.4 10*3/uL — ABNORMAL HIGH (ref 4.0–10.5)

## 2013-05-30 LAB — PHOSPHORUS: Phosphorus: 3.6 mg/dL (ref 2.3–4.6)

## 2013-05-30 LAB — MAGNESIUM: MAGNESIUM: 2.1 mg/dL (ref 1.5–2.5)

## 2013-05-30 NOTE — Progress Notes (Signed)
Name: Scott CritchleyFred C Mejia MRN: [id number] DOB: 1931/09/14    ADMISSION DATE:  05/09/2013 CONSULTATION DATE:  05/11/13  REFERRING MD :  Sharyon MedicusHijazi [facility]IMARY [facility]  CHIEF COMPLAINT:  Recurrent PNA, hypoxia   BRIEF PATIENT DESCRIPTION: 80 y/o male with hx COPD on home O2 (4 liters), Afib on xarelto, pulmonary HTN [facility].  Has had recurrent PNA (5 hospitalization for PNA in last 1 year) with suspicion for recurrent aspiration.     SIGNIFICANT EVENTS / STUDIES:   2D echo [facility]([place]) >>>"EF 60%,  Elevated pulmonary pressures" VQ scan ([place])>>> low probability PE  ............................................................... 4/10 40LPM high flow O2, sats 90%, no distress, on lasix gtt 4/10 ECHO >> EF 60-65%, nml wall motion, PA peak 61 4/13 O2 reduced to 30 LPM, 65% high flow O2 4/15 remains on 65 % fio2 4/20 fio2 down to  to 40% 4/23 remains on 40% fio2  SUBJECTIVE: NAD at rest  VITAL SIGNS: 28% high flow    PHYSICAL EXAMINATION: General:  Chronically ill appearing male, NAD.  Sleeping till aroused, sats 91% while asleep Neuro:  AAOx4, speech clear, MAE  HEENT:  Mm moist, +JVD  Cardiovascular:  s1s2 rrr Lungs:  resps even non labored on high flow [place](40%), lungs bilaterally diminished,  Abdomen:  Soft, nt, +bs Musculoskeletal:  Warm and dry, 2+ pitting edema BLE mild   Recent Labs Lab 05/26/13 0540 05/27/13 0640 05/28/13 0645  NA 145 145 145  K 4.2 4.6 4.3  CL 104 105 105  CO2 28 29 26   BUN 40* 38* 27*  CREATININE 1.70* 1.72* 1.34  GLUCOSE 104* 104* 117*    Recent Labs Lab 05/26/13 0540 05/28/13 0645  HGB 9.8* 9.7*  HCT 31.0* 31.1*  WBC 12.5* 10.1  PLT 445* 360   Dg Chest Port 1 View  05/30/2013   CLINICAL DATA:  Pneumonia, CHF.  EXAM: PORTABLE CHEST - 1 VIEW  COMPARISON:  DG CHEST 1V PORT dated 05/28/2013; DG CHEST 1V PORT dated 05/26/2013; CT CHEST W/O CM dated 05/11/2013  FINDINGS: Cardiac silhouette is  enlarged, unchanged. Status post median sternotomy for apparent Coronary artery bypass grafting. Biapical bullous changes with similar interstitial prominence, no pleural effusions or focal consolidations. Nodular density projects in the left costophrenic angle.  Right PICC distal tip projects in mid superior vena cava. No pneumothorax. Multiple EKG lines overlie the patient and may obscure subtle underlying pathology.  IMPRESSION: Stable cardiomegaly and interstitial prominence.  No apparent change in life-support.  Nodular density projecting in left costophrenic angle, recommend close attention on follow-up imaging.   Electronically Signed   By: Awilda Metroourtnay  Bloomer   On: 05/30/2013 05:51  bilat interstitial infiltrates unchanged c/w prior film  ASSESSMENT:  Acute on chronic respiratory failure 2nd to COPD/emphysema and basilar predominate fibrosis (?from chronic aspiration vs inflammatory; serology from 05/11/13 negative).  16 mm nodule RUL. LLL nodule Secondary pulmonary HTN.  Discussion Seems to be getting better w/ amio off.  Currently tolerating dysphagia diet.  PLAN -adjust oxygen to keep SpO2 > 88% >>consider trying oximyzer -cont dysphagia precautions  -continue anoro, flovent, and  albuterol  prn -defer assessment of RUL nodule, will repeat CXR  -negative fluid balance as tolerated per primary team(note bun/creatine rise 4/23) -no amio, likely was a contributer  Wife updated  Anders SimmondsPete Babcock ACNP-BC [facility] Pager # [phone] OR # [phone] if no answer   I have fully examined this patient and agree with  above findings.    And edited in full  Mcarthur RossettiDaniel J. Tyson AliasFeinstein, MD, FACP Pgr: [phone] [facility]

## 2013-05-31 ENCOUNTER — Other Ambulatory Visit (HOSPITAL_COMMUNITY): Payer: Medicare Other

## 2013-05-31 LAB — PREALBUMIN: Prealbumin: 18.4 mg/dL (ref 17.0–34.0)

## 2013-06-01 LAB — CBC
HCT: 32.4 % — ABNORMAL LOW (ref 39.0–52.0)
Hemoglobin: 9.9 g/dL — ABNORMAL LOW (ref 13.0–17.0)
MCH: 27.8 pg (ref 26.0–34.0)
MCHC: 30.6 g/dL (ref 30.0–36.0)
MCV: 91 fL (ref 78.0–100.0)
PLATELETS: 301 10*3/uL (ref 150–400)
RBC: 3.56 MIL/uL — ABNORMAL LOW (ref 4.22–5.81)
RDW: 15.9 % — AB (ref 11.5–15.5)
WBC: 12.6 10*3/uL — AB (ref 4.0–10.5)

## 2013-06-01 LAB — BASIC METABOLIC PANEL
BUN: 35 mg/dL — ABNORMAL HIGH (ref 6–23)
CALCIUM: 8.7 mg/dL (ref 8.4–10.5)
CO2: 28 mEq/L (ref 19–32)
CREATININE: 2.42 mg/dL — AB (ref 0.50–1.35)
Chloride: 106 mEq/L (ref 96–112)
GFR calc non Af Amer: 24 mL/min — ABNORMAL LOW (ref >90)
GFR, EST AFRICAN AMERICAN: 28 mL/min — AB (ref >90)
Glucose, Bld: 123 mg/dL — ABNORMAL HIGH (ref 70–99)
Potassium: 4.3 mEq/L (ref 3.7–5.3)
Sodium: 146 mEq/L (ref 137–147)

## 2013-06-01 LAB — PHOSPHORUS: Phosphorus: 4.8 mg/dL — ABNORMAL HIGH (ref 2.3–4.6)

## 2013-06-01 LAB — MAGNESIUM: Magnesium: 2.2 mg/dL (ref 1.5–2.5)

## 2013-07-05 ENCOUNTER — Inpatient Hospital Stay
Admission: AD | Admit: 2013-07-05 | Discharge: 2013-08-03 | Disposition: E | Payer: Self-pay | Source: Ambulatory Visit | Attending: Internal Medicine | Admitting: Internal Medicine

## 2013-07-05 ENCOUNTER — Other Ambulatory Visit (HOSPITAL_COMMUNITY): Payer: Self-pay

## 2013-07-06 ENCOUNTER — Other Ambulatory Visit (HOSPITAL_COMMUNITY): Payer: Self-pay

## 2013-07-06 LAB — BLOOD GAS, ARTERIAL
Acid-Base Excess: 7.7 mmol/L — ABNORMAL HIGH (ref 0.0–2.0)
Bicarbonate: 32.2 mEq/L — ABNORMAL HIGH (ref 20.0–24.0)
FIO2: 0.65 %
O2 Saturation: 99.3 %
Patient temperature: 98.6
TCO2: 33.7 mmol/L (ref 0–100)
pCO2 arterial: 49.5 mmHg — ABNORMAL HIGH (ref 35.0–45.0)
pH, Arterial: 7.429 (ref 7.350–7.450)
pO2, Arterial: 80.4 mmHg (ref 80.0–100.0)

## 2013-07-06 LAB — CBC WITH DIFFERENTIAL/PLATELET
BASOS PCT: 0 % (ref 0–1)
Basophils Absolute: 0 10*3/uL (ref 0.0–0.1)
EOS ABS: 0 10*3/uL (ref 0.0–0.7)
Eosinophils Relative: 0 % (ref 0–5)
HCT: 25.2 % — ABNORMAL LOW (ref 39.0–52.0)
Hemoglobin: 7.6 g/dL — ABNORMAL LOW (ref 13.0–17.0)
Lymphocytes Relative: 8 % — ABNORMAL LOW (ref 12–46)
Lymphs Abs: 1.6 10*3/uL (ref 0.7–4.0)
MCH: 26.6 pg (ref 26.0–34.0)
MCHC: 30.2 g/dL (ref 30.0–36.0)
MCV: 88.1 fL (ref 78.0–100.0)
MONOS PCT: 10 % (ref 3–12)
Monocytes Absolute: 2 10*3/uL — ABNORMAL HIGH (ref 0.1–1.0)
Neutro Abs: 16.1 10*3/uL — ABNORMAL HIGH (ref 1.7–7.7)
Neutrophils Relative %: 82 % — ABNORMAL HIGH (ref 43–77)
PLATELETS: 305 10*3/uL (ref 150–400)
RBC: 2.86 MIL/uL — ABNORMAL LOW (ref 4.22–5.81)
RDW: 17.3 % — ABNORMAL HIGH (ref 11.5–15.5)
WBC: 19.7 10*3/uL — ABNORMAL HIGH (ref 4.0–10.5)

## 2013-07-06 LAB — TSH: TSH: 1.05 u[IU]/mL (ref 0.350–4.500)

## 2013-07-06 LAB — PREALBUMIN: Prealbumin: 21.8 mg/dL (ref 17.0–34.0)

## 2013-07-06 LAB — COMPREHENSIVE METABOLIC PANEL
ALT: 16 U/L (ref 0–53)
AST: 15 U/L (ref 0–37)
Albumin: 2.4 g/dL — ABNORMAL LOW (ref 3.5–5.2)
Alkaline Phosphatase: 132 U/L — ABNORMAL HIGH (ref 39–117)
BUN: 26 mg/dL — ABNORMAL HIGH (ref 6–23)
CO2: 34 mEq/L — ABNORMAL HIGH (ref 19–32)
Calcium: 8.3 mg/dL — ABNORMAL LOW (ref 8.4–10.5)
Chloride: 105 mEq/L (ref 96–112)
Creatinine, Ser: 1.02 mg/dL (ref 0.50–1.35)
GFR calc Af Amer: 79 mL/min — ABNORMAL LOW (ref >90)
GFR calc non Af Amer: 68 mL/min — ABNORMAL LOW (ref >90)
Glucose, Bld: 121 mg/dL — ABNORMAL HIGH (ref 70–99)
Potassium: 3.9 mEq/L (ref 3.7–5.3)
Sodium: 148 mEq/L — ABNORMAL HIGH (ref 137–147)
Total Bilirubin: 1.2 mg/dL (ref 0.3–1.2)
Total Protein: 5.5 g/dL — ABNORMAL LOW (ref 6.0–8.3)

## 2013-07-06 LAB — VITAMIN B12: Vitamin B-12: 326 pg/mL (ref 211–911)

## 2013-07-06 LAB — T4, FREE: Free T4: 2.02 ng/dL — ABNORMAL HIGH (ref 0.80–1.80)

## 2013-07-06 LAB — MAGNESIUM: MAGNESIUM: 2.2 mg/dL (ref 1.5–2.5)

## 2013-07-07 LAB — CBC
HCT: 24.7 % — ABNORMAL LOW (ref 39.0–52.0)
Hemoglobin: 7.6 g/dL — ABNORMAL LOW (ref 13.0–17.0)
MCH: 26 pg (ref 26.0–34.0)
MCHC: 30.8 g/dL (ref 30.0–36.0)
MCV: 84.6 fL (ref 78.0–100.0)
PLATELETS: 329 10*3/uL (ref 150–400)
RBC: 2.92 MIL/uL — ABNORMAL LOW (ref 4.22–5.81)
RDW: 17.4 % — ABNORMAL HIGH (ref 11.5–15.5)
WBC: 20.3 10*3/uL — ABNORMAL HIGH (ref 4.0–10.5)

## 2013-07-07 LAB — BASIC METABOLIC PANEL
BUN: 29 mg/dL — ABNORMAL HIGH (ref 6–23)
CALCIUM: 8.3 mg/dL — AB (ref 8.4–10.5)
CO2: 31 mEq/L (ref 19–32)
Chloride: 100 mEq/L (ref 96–112)
Creatinine, Ser: 1.04 mg/dL (ref 0.50–1.35)
GFR calc non Af Amer: 66 mL/min — ABNORMAL LOW (ref >90)
GFR, EST AFRICAN AMERICAN: 77 mL/min — AB (ref >90)
Glucose, Bld: 124 mg/dL — ABNORMAL HIGH (ref 70–99)
POTASSIUM: 4 meq/L (ref 3.7–5.3)
Sodium: 143 mEq/L (ref 137–147)

## 2013-07-07 LAB — FOLATE RBC: RBC FOLATE: 1177 ng/mL — AB (ref >280)

## 2013-07-08 LAB — CBC
HCT: 25.8 % — ABNORMAL LOW (ref 39.0–52.0)
Hemoglobin: 7.9 g/dL — ABNORMAL LOW (ref 13.0–17.0)
MCH: 26.6 pg (ref 26.0–34.0)
MCHC: 30.6 g/dL (ref 30.0–36.0)
MCV: 86.9 fL (ref 78.0–100.0)
PLATELETS: 321 10*3/uL (ref 150–400)
RBC: 2.97 MIL/uL — ABNORMAL LOW (ref 4.22–5.81)
RDW: 17.4 % — ABNORMAL HIGH (ref 11.5–15.5)
WBC: 20.6 10*3/uL — ABNORMAL HIGH (ref 4.0–10.5)

## 2013-07-10 LAB — CBC
HEMATOCRIT: 24.1 % — AB (ref 39.0–52.0)
Hemoglobin: 7.5 g/dL — ABNORMAL LOW (ref 13.0–17.0)
MCH: 26.3 pg (ref 26.0–34.0)
MCHC: 31.1 g/dL (ref 30.0–36.0)
MCV: 84.6 fL (ref 78.0–100.0)
Platelets: 332 10*3/uL (ref 150–400)
RBC: 2.85 MIL/uL — AB (ref 4.22–5.81)
RDW: 17.6 % — AB (ref 11.5–15.5)
WBC: 20.2 10*3/uL — AB (ref 4.0–10.5)

## 2013-07-11 ENCOUNTER — Other Ambulatory Visit (HOSPITAL_COMMUNITY): Payer: Self-pay

## 2013-07-11 LAB — CBC
HEMATOCRIT: 25 % — AB (ref 39.0–52.0)
HEMOGLOBIN: 7.9 g/dL — AB (ref 13.0–17.0)
MCH: 26.6 pg (ref 26.0–34.0)
MCHC: 31.6 g/dL (ref 30.0–36.0)
MCV: 84.2 fL (ref 78.0–100.0)
Platelets: 329 10*3/uL (ref 150–400)
RBC: 2.97 MIL/uL — AB (ref 4.22–5.81)
RDW: 17.7 % — ABNORMAL HIGH (ref 11.5–15.5)
WBC: 20.4 10*3/uL — ABNORMAL HIGH (ref 4.0–10.5)

## 2013-07-11 LAB — BASIC METABOLIC PANEL
BUN: 34 mg/dL — ABNORMAL HIGH (ref 6–23)
CALCIUM: 8.3 mg/dL — AB (ref 8.4–10.5)
CO2: 32 meq/L (ref 19–32)
Chloride: 98 mEq/L (ref 96–112)
Creatinine, Ser: 1.14 mg/dL (ref 0.50–1.35)
GFR calc Af Amer: 69 mL/min — ABNORMAL LOW (ref >90)
GFR calc non Af Amer: 59 mL/min — ABNORMAL LOW (ref >90)
GLUCOSE: 170 mg/dL — AB (ref 70–99)
Potassium: 4.4 mEq/L (ref 3.7–5.3)
Sodium: 141 mEq/L (ref 137–147)

## 2013-07-11 LAB — PROCALCITONIN: Procalcitonin: <0.1 ng/mL

## 2013-07-13 LAB — CBC WITH DIFFERENTIAL/PLATELET
Basophils Absolute: 0 10*3/uL (ref 0.0–0.1)
Basophils Relative: 0 % (ref 0–1)
EOS PCT: 0 % (ref 0–5)
Eosinophils Absolute: 0.1 10*3/uL (ref 0.0–0.7)
HEMATOCRIT: 25 % — AB (ref 39.0–52.0)
Hemoglobin: 7.8 g/dL — ABNORMAL LOW (ref 13.0–17.0)
LYMPHS ABS: 1.5 10*3/uL (ref 0.7–4.0)
Lymphocytes Relative: 8 % — ABNORMAL LOW (ref 12–46)
MCH: 26.1 pg (ref 26.0–34.0)
MCHC: 31.2 g/dL (ref 30.0–36.0)
MCV: 83.6 fL (ref 78.0–100.0)
MONO ABS: 1.6 10*3/uL — AB (ref 0.1–1.0)
Monocytes Relative: 9 % (ref 3–12)
Neutro Abs: 15.6 10*3/uL — ABNORMAL HIGH (ref 1.7–7.7)
Neutrophils Relative %: 83 % — ABNORMAL HIGH (ref 43–77)
Platelets: 284 10*3/uL (ref 150–400)
RBC: 2.99 MIL/uL — AB (ref 4.22–5.81)
RDW: 18.1 % — AB (ref 11.5–15.5)
WBC: 18.8 10*3/uL — ABNORMAL HIGH (ref 4.0–10.5)

## 2013-07-13 LAB — APTT: aPTT: 29 seconds (ref 24–37)

## 2013-07-13 LAB — PROTIME-INR
INR: 1.07 (ref 0.00–1.49)
Prothrombin Time: 13.7 seconds (ref 11.6–15.2)

## 2013-07-15 LAB — BASIC METABOLIC PANEL
BUN: 39 mg/dL — ABNORMAL HIGH (ref 6–23)
CALCIUM: 8.7 mg/dL (ref 8.4–10.5)
CO2: 33 mEq/L — ABNORMAL HIGH (ref 19–32)
Chloride: 98 mEq/L (ref 96–112)
Creatinine, Ser: 1.38 mg/dL — ABNORMAL HIGH (ref 0.50–1.35)
GFR calc Af Amer: 55 mL/min — ABNORMAL LOW (ref >90)
GFR calc non Af Amer: 47 mL/min — ABNORMAL LOW (ref >90)
GLUCOSE: 117 mg/dL — AB (ref 70–99)
Potassium: 4.4 mEq/L (ref 3.7–5.3)
Sodium: 142 mEq/L (ref 137–147)

## 2013-07-15 LAB — CBC WITH DIFFERENTIAL/PLATELET
Basophils Absolute: 0 10*3/uL (ref 0.0–0.1)
Basophils Relative: 0 % (ref 0–1)
EOS PCT: 1 % (ref 0–5)
Eosinophils Absolute: 0.2 10*3/uL (ref 0.0–0.7)
HCT: 25.9 % — ABNORMAL LOW (ref 39.0–52.0)
HEMOGLOBIN: 8.1 g/dL — AB (ref 13.0–17.0)
LYMPHS PCT: 6 % — AB (ref 12–46)
Lymphs Abs: 0.9 10*3/uL (ref 0.7–4.0)
MCH: 26.8 pg (ref 26.0–34.0)
MCHC: 31.3 g/dL (ref 30.0–36.0)
MCV: 85.8 fL (ref 78.0–100.0)
Monocytes Absolute: 1.1 10*3/uL — ABNORMAL HIGH (ref 0.1–1.0)
Monocytes Relative: 7 % (ref 3–12)
Neutro Abs: 12.8 10*3/uL — ABNORMAL HIGH (ref 1.7–7.7)
Neutrophils Relative %: 86 % — ABNORMAL HIGH (ref 43–77)
Platelets: 226 10*3/uL (ref 150–400)
RBC: 3.02 MIL/uL — AB (ref 4.22–5.81)
RDW: 18.3 % — ABNORMAL HIGH (ref 11.5–15.5)
WBC: 15 10*3/uL — ABNORMAL HIGH (ref 4.0–10.5)

## 2013-07-16 LAB — BASIC METABOLIC PANEL
BUN: 40 mg/dL — ABNORMAL HIGH (ref 6–23)
CALCIUM: 8.7 mg/dL (ref 8.4–10.5)
CHLORIDE: 97 meq/L (ref 96–112)
CO2: 30 meq/L (ref 19–32)
Creatinine, Ser: 1.4 mg/dL — ABNORMAL HIGH (ref 0.50–1.35)
GFR calc Af Amer: 54 mL/min — ABNORMAL LOW (ref >90)
GFR calc non Af Amer: 46 mL/min — ABNORMAL LOW (ref >90)
Glucose, Bld: 144 mg/dL — ABNORMAL HIGH (ref 70–99)
Potassium: 5.1 mEq/L (ref 3.7–5.3)
SODIUM: 140 meq/L (ref 137–147)

## 2013-07-17 LAB — BASIC METABOLIC PANEL
BUN: 36 mg/dL — ABNORMAL HIGH (ref 6–23)
CO2: 31 mEq/L (ref 19–32)
Calcium: 8.5 mg/dL (ref 8.4–10.5)
Chloride: 97 mEq/L (ref 96–112)
Creatinine, Ser: 1.19 mg/dL (ref 0.50–1.35)
GFR calc Af Amer: 65 mL/min — ABNORMAL LOW (ref >90)
GFR calc non Af Amer: 56 mL/min — ABNORMAL LOW (ref >90)
GLUCOSE: 110 mg/dL — AB (ref 70–99)
Potassium: 4.5 mEq/L (ref 3.7–5.3)
Sodium: 140 mEq/L (ref 137–147)

## 2013-07-18 LAB — URINALYSIS, ROUTINE W REFLEX MICROSCOPIC
BILIRUBIN URINE: NEGATIVE
Glucose, UA: NEGATIVE mg/dL
KETONES UR: NEGATIVE mg/dL
Nitrite: NEGATIVE
Protein, ur: NEGATIVE mg/dL
Specific Gravity, Urine: 1.016 (ref 1.005–1.030)
UROBILINOGEN UA: 0.2 mg/dL (ref 0.0–1.0)
pH: 5 (ref 5.0–8.0)

## 2013-07-18 LAB — COMPREHENSIVE METABOLIC PANEL
ALK PHOS: 161 U/L — AB (ref 39–117)
ALT: 21 U/L (ref 0–53)
AST: 22 U/L (ref 0–37)
Albumin: 2.5 g/dL — ABNORMAL LOW (ref 3.5–5.2)
BILIRUBIN TOTAL: 0.4 mg/dL (ref 0.3–1.2)
BUN: 29 mg/dL — AB (ref 6–23)
CHLORIDE: 97 meq/L (ref 96–112)
CO2: 32 meq/L (ref 19–32)
Calcium: 8.7 mg/dL (ref 8.4–10.5)
Creatinine, Ser: 1.18 mg/dL (ref 0.50–1.35)
GFR calc non Af Amer: 57 mL/min — ABNORMAL LOW (ref >90)
GFR, EST AFRICAN AMERICAN: 66 mL/min — AB (ref >90)
GLUCOSE: 184 mg/dL — AB (ref 70–99)
POTASSIUM: 4.6 meq/L (ref 3.7–5.3)
Sodium: 141 mEq/L (ref 137–147)
Total Protein: 6 g/dL (ref 6.0–8.3)

## 2013-07-18 LAB — CBC WITH DIFFERENTIAL/PLATELET
Basophils Absolute: 0 10*3/uL (ref 0.0–0.1)
Basophils Relative: 0 % (ref 0–1)
Eosinophils Absolute: 0 10*3/uL (ref 0.0–0.7)
Eosinophils Relative: 0 % (ref 0–5)
HCT: 27.2 % — ABNORMAL LOW (ref 39.0–52.0)
Hemoglobin: 8.2 g/dL — ABNORMAL LOW (ref 13.0–17.0)
LYMPHS PCT: 10 % — AB (ref 12–46)
Lymphs Abs: 1 10*3/uL (ref 0.7–4.0)
MCH: 25.4 pg — AB (ref 26.0–34.0)
MCHC: 30.1 g/dL (ref 30.0–36.0)
MCV: 84.2 fL (ref 78.0–100.0)
MONOS PCT: 4 % (ref 3–12)
Monocytes Absolute: 0.4 10*3/uL (ref 0.1–1.0)
NEUTROS ABS: 9.1 10*3/uL — AB (ref 1.7–7.7)
Neutrophils Relative %: 86 % — ABNORMAL HIGH (ref 43–77)
Platelets: 202 10*3/uL (ref 150–400)
RBC: 3.23 MIL/uL — AB (ref 4.22–5.81)
RDW: 18 % — ABNORMAL HIGH (ref 11.5–15.5)
WBC: 10.5 10*3/uL (ref 4.0–10.5)

## 2013-07-18 LAB — URINE MICROSCOPIC-ADD ON

## 2013-07-18 LAB — PREALBUMIN: Prealbumin: 23.6 mg/dL (ref 17.0–34.0)

## 2013-07-19 ENCOUNTER — Other Ambulatory Visit (HOSPITAL_COMMUNITY): Payer: Self-pay

## 2013-07-20 ENCOUNTER — Other Ambulatory Visit (HOSPITAL_COMMUNITY): Payer: Self-pay

## 2013-07-20 LAB — CBC
HEMATOCRIT: 28.7 % — AB (ref 39.0–52.0)
HEMOGLOBIN: 8.8 g/dL — AB (ref 13.0–17.0)
MCH: 25.6 pg — ABNORMAL LOW (ref 26.0–34.0)
MCHC: 30.7 g/dL (ref 30.0–36.0)
MCV: 83.4 fL (ref 78.0–100.0)
Platelets: 205 10*3/uL (ref 150–400)
RBC: 3.44 MIL/uL — ABNORMAL LOW (ref 4.22–5.81)
RDW: 17.7 % — AB (ref 11.5–15.5)
WBC: 6.6 10*3/uL (ref 4.0–10.5)

## 2013-07-20 LAB — BASIC METABOLIC PANEL
BUN: 40 mg/dL — ABNORMAL HIGH (ref 6–23)
CHLORIDE: 94 meq/L — AB (ref 96–112)
CO2: 27 mEq/L (ref 19–32)
CREATININE: 2.32 mg/dL — AB (ref 0.50–1.35)
Calcium: 8.3 mg/dL — ABNORMAL LOW (ref 8.4–10.5)
GFR calc non Af Amer: 25 mL/min — ABNORMAL LOW (ref >90)
GFR, EST AFRICAN AMERICAN: 29 mL/min — AB (ref >90)
GLUCOSE: 138 mg/dL — AB (ref 70–99)
POTASSIUM: 5.2 meq/L (ref 3.7–5.3)
Sodium: 138 mEq/L (ref 137–147)

## 2013-07-20 LAB — BLOOD GAS, ARTERIAL
Acid-Base Excess: 5.5 mmol/L — ABNORMAL HIGH (ref 0.0–2.0)
BICARBONATE: 28.9 meq/L — AB (ref 20.0–24.0)
O2 Content: 2 L/min
O2 Saturation: 94.7 %
PCO2 ART: 37.6 mmHg (ref 35.0–45.0)
PH ART: 7.497 — AB (ref 7.350–7.450)
PO2 ART: 61 mmHg — AB (ref 80.0–100.0)
Patient temperature: 98.6
TCO2: 30 mmol/L (ref 0–100)

## 2013-07-20 LAB — LACTIC ACID, PLASMA: Lactic Acid, Venous: 1.8 mmol/L (ref 0.5–2.2)

## 2013-07-20 LAB — PROCALCITONIN: Procalcitonin: 1.81 ng/mL

## 2013-07-21 LAB — URINE CULTURE: Colony Count: >=100000

## 2013-07-21 LAB — CBC WITH DIFFERENTIAL/PLATELET
BASOS ABS: 0 10*3/uL (ref 0.0–0.1)
Basophils Relative: 0 % (ref 0–1)
EOS PCT: 0 % (ref 0–5)
Eosinophils Absolute: 0 10*3/uL (ref 0.0–0.7)
HEMATOCRIT: 28.6 % — AB (ref 39.0–52.0)
Hemoglobin: 8.8 g/dL — ABNORMAL LOW (ref 13.0–17.0)
LYMPHS ABS: 0.9 10*3/uL (ref 0.7–4.0)
Lymphocytes Relative: 11 % — ABNORMAL LOW (ref 12–46)
MCH: 24.9 pg — ABNORMAL LOW (ref 26.0–34.0)
MCHC: 30.8 g/dL (ref 30.0–36.0)
MCV: 80.8 fL (ref 78.0–100.0)
MONOS PCT: 10 % (ref 3–12)
Monocytes Absolute: 0.8 10*3/uL (ref 0.1–1.0)
NEUTROS ABS: 6.7 10*3/uL (ref 1.7–7.7)
Neutrophils Relative %: 79 % — ABNORMAL HIGH (ref 43–77)
Platelets: 224 10*3/uL (ref 150–400)
RBC: 3.54 MIL/uL — AB (ref 4.22–5.81)
RDW: 17.5 % — AB (ref 11.5–15.5)
WBC: 8.4 10*3/uL (ref 4.0–10.5)

## 2013-07-21 LAB — BASIC METABOLIC PANEL
BUN: 45 mg/dL — ABNORMAL HIGH (ref 6–23)
CO2: 28 mEq/L (ref 19–32)
Calcium: 8.2 mg/dL — ABNORMAL LOW (ref 8.4–10.5)
Chloride: 93 mEq/L — ABNORMAL LOW (ref 96–112)
Creatinine, Ser: 2.55 mg/dL — ABNORMAL HIGH (ref 0.50–1.35)
GFR, EST AFRICAN AMERICAN: 26 mL/min — AB (ref >90)
GFR, EST NON AFRICAN AMERICAN: 22 mL/min — AB (ref >90)
GLUCOSE: 126 mg/dL — AB (ref 70–99)
POTASSIUM: 5.7 meq/L — AB (ref 3.7–5.3)
SODIUM: 135 meq/L — AB (ref 137–147)

## 2013-07-21 LAB — POTASSIUM: POTASSIUM: 5.9 meq/L — AB (ref 3.7–5.3)

## 2013-07-21 LAB — CLOSTRIDIUM DIFFICILE BY PCR: Toxigenic C. Difficile by PCR: POSITIVE — AB

## 2013-07-22 ENCOUNTER — Other Ambulatory Visit (HOSPITAL_COMMUNITY): Payer: Self-pay

## 2013-07-22 LAB — BASIC METABOLIC PANEL
BUN: 60 mg/dL — ABNORMAL HIGH (ref 6–23)
CHLORIDE: 92 meq/L — AB (ref 96–112)
CO2: 22 mEq/L (ref 19–32)
Calcium: 7.9 mg/dL — ABNORMAL LOW (ref 8.4–10.5)
Creatinine, Ser: 3.91 mg/dL — ABNORMAL HIGH (ref 0.50–1.35)
GFR calc non Af Amer: 13 mL/min — ABNORMAL LOW (ref >90)
GFR, EST AFRICAN AMERICAN: 15 mL/min — AB (ref >90)
Glucose, Bld: 262 mg/dL — ABNORMAL HIGH (ref 70–99)
POTASSIUM: 6 meq/L — AB (ref 3.7–5.3)
Sodium: 136 mEq/L — ABNORMAL LOW (ref 137–147)

## 2013-07-22 LAB — CBC
HCT: 32.7 % — ABNORMAL LOW (ref 39.0–52.0)
Hemoglobin: 10.3 g/dL — ABNORMAL LOW (ref 13.0–17.0)
MCH: 25.2 pg — ABNORMAL LOW (ref 26.0–34.0)
MCHC: 31.5 g/dL (ref 30.0–36.0)
MCV: 80.1 fL (ref 78.0–100.0)
PLATELETS: 249 10*3/uL (ref 150–400)
RBC: 4.08 MIL/uL — ABNORMAL LOW (ref 4.22–5.81)
RDW: 17.3 % — AB (ref 11.5–15.5)
WBC: 15.2 10*3/uL — AB (ref 4.0–10.5)

## 2013-07-22 LAB — POTASSIUM: POTASSIUM: 6.2 meq/L — AB (ref 3.7–5.3)

## 2013-07-23 ENCOUNTER — Other Ambulatory Visit (HOSPITAL_COMMUNITY): Payer: Self-pay

## 2013-07-23 LAB — CBC WITH DIFFERENTIAL/PLATELET
Basophils Absolute: 0.1 10*3/uL (ref 0.0–0.1)
Basophils Relative: 1 % (ref 0–1)
EOS ABS: 0 10*3/uL (ref 0.0–0.7)
EOS PCT: 0 % (ref 0–5)
HCT: 31.2 % — ABNORMAL LOW (ref 39.0–52.0)
Hemoglobin: 9.4 g/dL — ABNORMAL LOW (ref 13.0–17.0)
Lymphocytes Relative: 9 % — ABNORMAL LOW (ref 12–46)
Lymphs Abs: 1.2 10*3/uL (ref 0.7–4.0)
MCH: 24.1 pg — ABNORMAL LOW (ref 26.0–34.0)
MCHC: 30.1 g/dL (ref 30.0–36.0)
MCV: 80 fL (ref 78.0–100.0)
Monocytes Absolute: 1.5 10*3/uL — ABNORMAL HIGH (ref 0.1–1.0)
Monocytes Relative: 11 % (ref 3–12)
NEUTROS PCT: 79 % — AB (ref 43–77)
Neutro Abs: 10.8 10*3/uL — ABNORMAL HIGH (ref 1.7–7.7)
PLATELETS: 178 10*3/uL (ref 150–400)
RBC: 3.9 MIL/uL — AB (ref 4.22–5.81)
RDW: 17.2 % — ABNORMAL HIGH (ref 11.5–15.5)
WBC: 13.6 10*3/uL — ABNORMAL HIGH (ref 4.0–10.5)

## 2013-07-23 LAB — URINALYSIS, ROUTINE W REFLEX MICROSCOPIC
GLUCOSE, UA: NEGATIVE mg/dL
HGB URINE DIPSTICK: NEGATIVE
KETONES UR: 15 mg/dL — AB
Nitrite: NEGATIVE
PROTEIN: 30 mg/dL — AB
Specific Gravity, Urine: 1.026 (ref 1.005–1.030)
Urobilinogen, UA: 1 mg/dL (ref 0.0–1.0)
pH: 5 (ref 5.0–8.0)

## 2013-07-23 LAB — BASIC METABOLIC PANEL
BUN: 74 mg/dL — ABNORMAL HIGH (ref 6–23)
BUN: 73 mg/dL — AB (ref 6–23)
CALCIUM: 7.2 mg/dL — AB (ref 8.4–10.5)
CALCIUM: 6.6 mg/dL — AB (ref 8.4–10.5)
CHLORIDE: 96 meq/L (ref 96–112)
CO2: 19 mEq/L (ref 19–32)
CO2: 19 mEq/L (ref 19–32)
CREATININE: 5.25 mg/dL — AB (ref 0.50–1.35)
Chloride: 90 mEq/L — ABNORMAL LOW (ref 96–112)
Creatinine, Ser: 5.01 mg/dL — ABNORMAL HIGH (ref 0.50–1.35)
GFR calc Af Amer: 11 mL/min — ABNORMAL LOW (ref >90)
GFR calc non Af Amer: 9 mL/min — ABNORMAL LOW (ref >90)
GFR, EST AFRICAN AMERICAN: 11 mL/min — AB (ref >90)
GFR, EST NON AFRICAN AMERICAN: 10 mL/min — AB (ref >90)
GLUCOSE: 133 mg/dL — AB (ref 70–99)
Glucose, Bld: 141 mg/dL — ABNORMAL HIGH (ref 70–99)
Potassium: 5.3 mEq/L (ref 3.7–5.3)
Potassium: 5.3 mEq/L (ref 3.7–5.3)
Sodium: 139 mEq/L (ref 137–147)
Sodium: 130 mEq/L — ABNORMAL LOW (ref 137–147)

## 2013-07-23 LAB — PRO B NATRIURETIC PEPTIDE: Pro B Natriuretic peptide (BNP): 1725 pg/mL — ABNORMAL HIGH (ref 0–450)

## 2013-07-23 LAB — PHOSPHORUS: Phosphorus: 9 mg/dL — ABNORMAL HIGH (ref 2.3–4.6)

## 2013-07-23 LAB — URINE MICROSCOPIC-ADD ON

## 2013-07-23 LAB — MAGNESIUM
MAGNESIUM: 2.4 mg/dL (ref 1.5–2.5)
Magnesium: 2.2 mg/dL (ref 1.5–2.5)

## 2013-07-24 ENCOUNTER — Other Ambulatory Visit (HOSPITAL_COMMUNITY): Payer: Medicare Other

## 2013-07-24 ENCOUNTER — Other Ambulatory Visit (HOSPITAL_COMMUNITY): Payer: Self-pay

## 2013-07-24 LAB — BASIC METABOLIC PANEL
BUN: 83 mg/dL — ABNORMAL HIGH (ref 6–23)
BUN: 72 mg/dL — ABNORMAL HIGH (ref 6–23)
CALCIUM: 7.3 mg/dL — AB (ref 8.4–10.5)
CALCIUM: 6.3 mg/dL — AB (ref 8.4–10.5)
CHLORIDE: 97 meq/L (ref 96–112)
CO2: 22 meq/L (ref 19–32)
CO2: 20 mEq/L (ref 19–32)
Chloride: 102 mEq/L (ref 96–112)
Creatinine, Ser: 5.37 mg/dL — ABNORMAL HIGH (ref 0.50–1.35)
Creatinine, Ser: 4.16 mg/dL — ABNORMAL HIGH (ref 0.50–1.35)
GFR calc Af Amer: 11 mL/min — ABNORMAL LOW (ref >90)
GFR calc Af Amer: 14 mL/min — ABNORMAL LOW (ref >90)
GFR calc non Af Amer: 9 mL/min — ABNORMAL LOW (ref >90)
GFR, EST NON AFRICAN AMERICAN: 12 mL/min — AB (ref >90)
Glucose, Bld: 145 mg/dL — ABNORMAL HIGH (ref 70–99)
Glucose, Bld: 616 mg/dL (ref 70–99)
POTASSIUM: 4.6 meq/L (ref 3.7–5.3)
Potassium: 6 mEq/L — ABNORMAL HIGH (ref 3.7–5.3)
SODIUM: 139 meq/L (ref 137–147)
Sodium: 138 mEq/L (ref 137–147)

## 2013-07-24 LAB — BLOOD GAS, ARTERIAL
Acid-base deficit: 2.9 mmol/L — ABNORMAL HIGH (ref 0.0–2.0)
Bicarbonate: 21.6 mEq/L (ref 20.0–24.0)
O2 Content: 6 L/min
O2 SAT: 93.2 %
PATIENT TEMPERATURE: 98.3
PO2 ART: 86.1 mmHg (ref 80.0–100.0)
TCO2: 22.8 mmol/L (ref 0–100)
pCO2 arterial: 38.8 mmHg (ref 35.0–45.0)
pH, Arterial: 7.364 (ref 7.350–7.450)

## 2013-07-24 LAB — URINALYSIS, ROUTINE W REFLEX MICROSCOPIC
BILIRUBIN URINE: NEGATIVE
Glucose, UA: NEGATIVE mg/dL
Ketones, ur: NEGATIVE mg/dL
Leukocytes, UA: NEGATIVE
Nitrite: NEGATIVE
PROTEIN: NEGATIVE mg/dL
Specific Gravity, Urine: 1.019 (ref 1.005–1.030)
Urobilinogen, UA: 0.2 mg/dL (ref 0.0–1.0)
pH: 5 (ref 5.0–8.0)

## 2013-07-24 LAB — PRO B NATRIURETIC PEPTIDE: Pro B Natriuretic peptide (BNP): 4073 pg/mL — ABNORMAL HIGH (ref 0–450)

## 2013-07-24 LAB — URINE MICROSCOPIC-ADD ON

## 2013-07-24 LAB — MAGNESIUM: MAGNESIUM: 2.5 mg/dL (ref 1.5–2.5)

## 2013-07-26 LAB — CULTURE, BLOOD (ROUTINE X 2)
CULTURE: NO GROWTH
Culture: NO GROWTH

## 2013-07-26 LAB — URINE CULTURE: Colony Count: 30000

## 2013-08-03 DEATH — deceased

## 2015-01-02 IMAGING — CR DG CHEST 1V PORT
1 series · 1 of 1 positions shown · non-contrast
Comparison: 05/19/2013

CLINICAL DATA: Edema

EXAM:
PORTABLE CHEST - 1 VIEW

[AP]
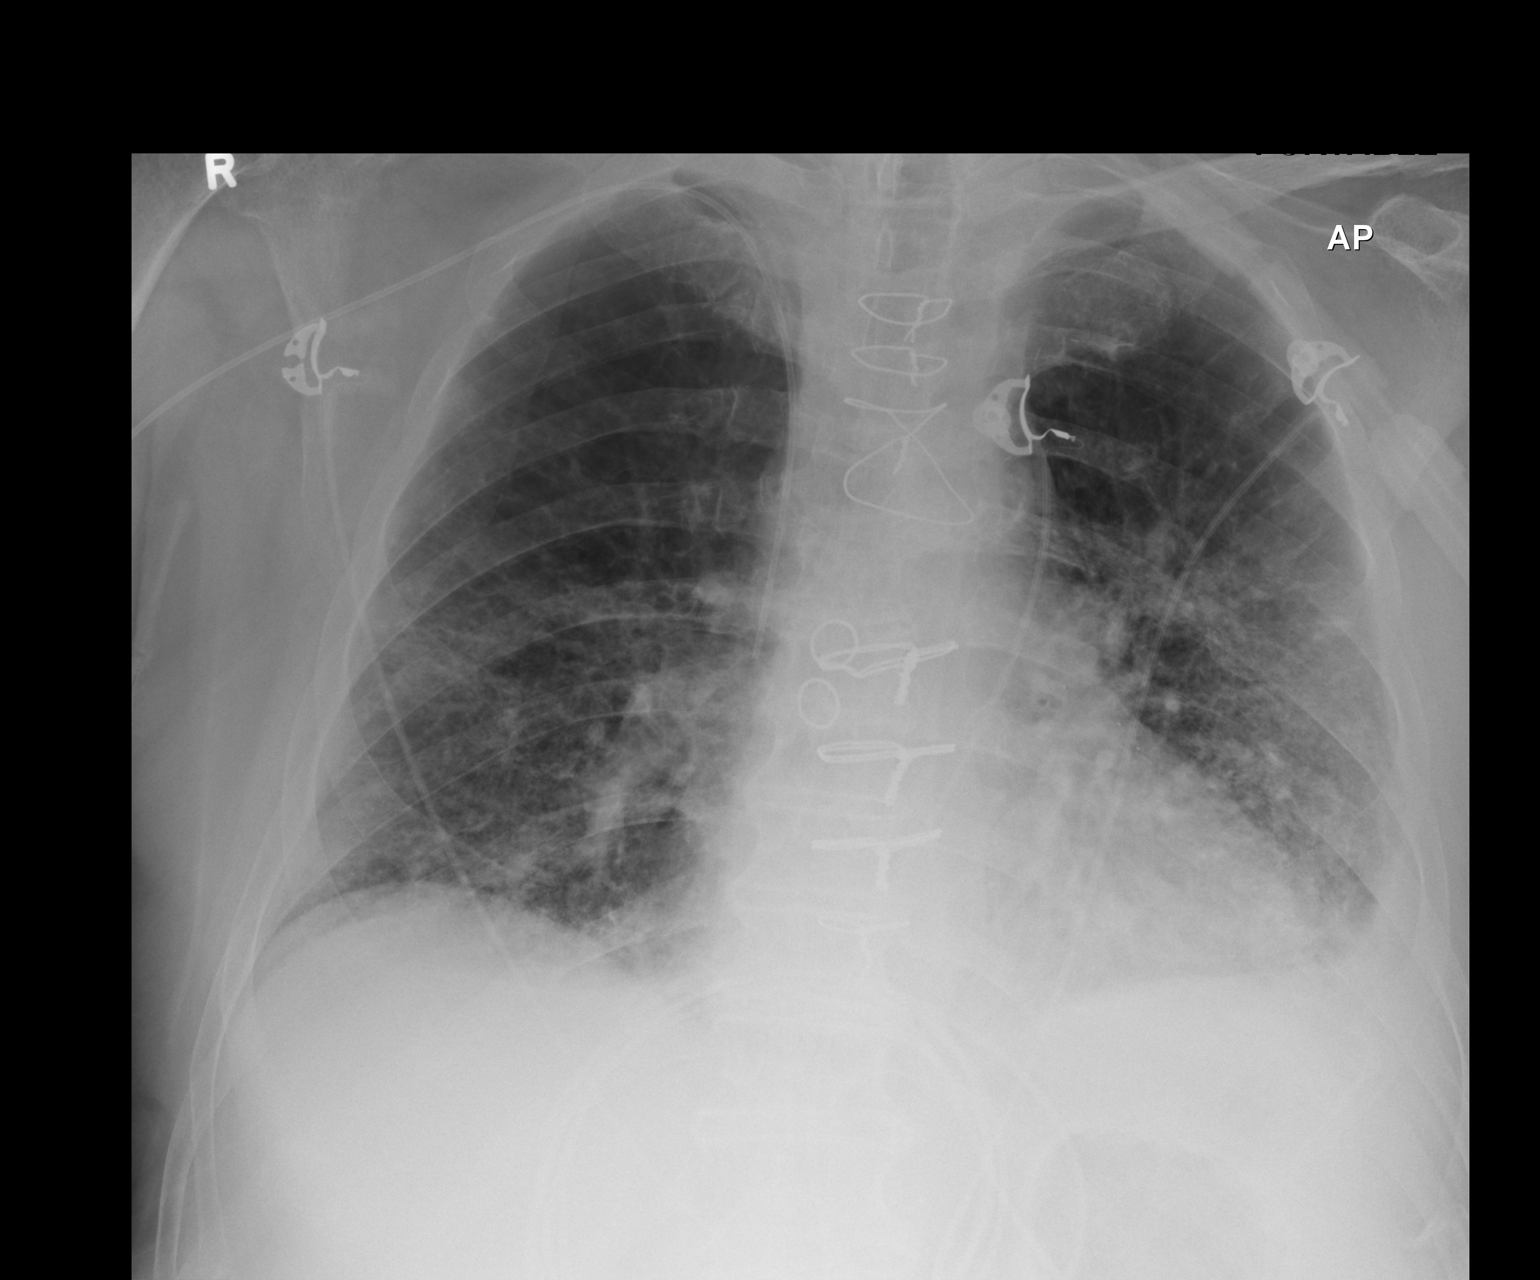

[1 of 1 positions shown; findings below may reference images not displayed]

FINDINGS: Bilateral irregular interstitial densities are again noted, with
evidence of mild improvement from the most recent prior exam.

No new lung opacities. There is a small left pleural effusion, which
is now apparent. No right effusion. No pneumothorax.

Changes from CABG surgery are stable. The cardiac silhouette is
normal in size. Normal mediastinal and hilar contours. Right PICC is
stable with its tip in the lower superior vena cava.
IMPRESSION: 1. Improved interstitial pulmonary edema versus interstitial
infection when compared to the most recent prior exam. No new
abnormalities.

## 2015-01-04 IMAGING — DX DG CHEST 1V PORT
1 series · 1 of 1 positions shown · non-contrast
Comparison: DG CHEST 1V PORT dated 05/26/2013; DG CHEST 1V PORT
dated 05/19/2013; DG CHEST 1V PORT dated 05/16/2013

CLINICAL DATA: Evaluate pneumonia

EXAM:
PORTABLE CHEST - 1 VIEW

[portable]
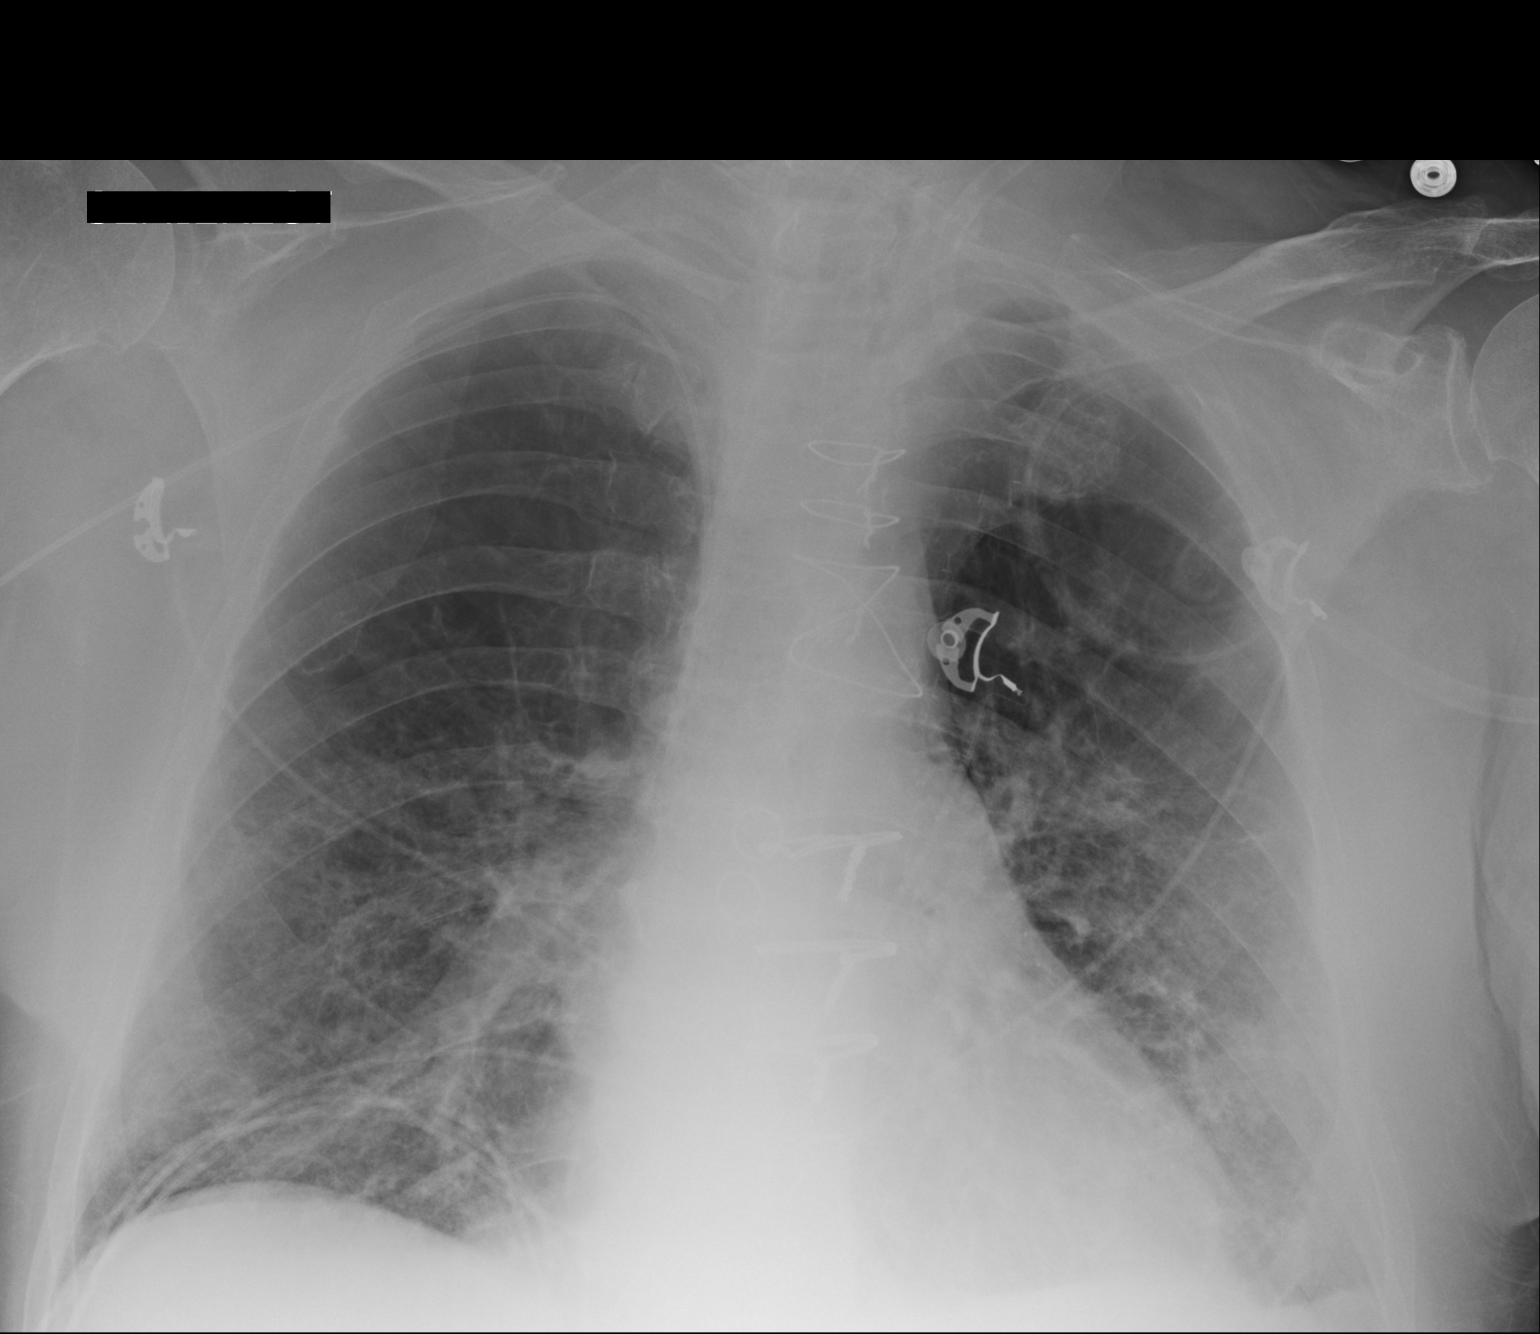

[1 of 1 positions shown; findings below may reference images not displayed]

FINDINGS: There is hyperinflation consistent with COPD. There is mild cardiac
enlargement status post CABG, stable. Right PICC line is in
unchanged position.

There is interstitial prominence in the right mid to lower lung
zone, and there is interstitial prominence in the left mid to lower
lung zone. This appearance is unchanged when compared to 05/26/2013.
The appearance is also not significantly different when compared to
05/16/2013, in terms of distribution, but the degree of interstitial
prominence does appear to be mildly improved when compared to
05/16/2013.
IMPRESSION: Bilateral mid to lower lung zone interstitial infiltrates, unchanged
from 05/26/2013 but improved in terms of severity when compared to
05/16/2013.

## 2015-01-07 IMAGING — CR DG CHEST 1V PORT
1 series · 1 of 1 positions shown · non-contrast
Comparison: Single view of the chest 05/30/2013 and 05/28/2013. CT
chest 05/11/2013.

CLINICAL DATA: Pneumonia.

EXAM:
PORTABLE CHEST - 1 VIEW

[AP]
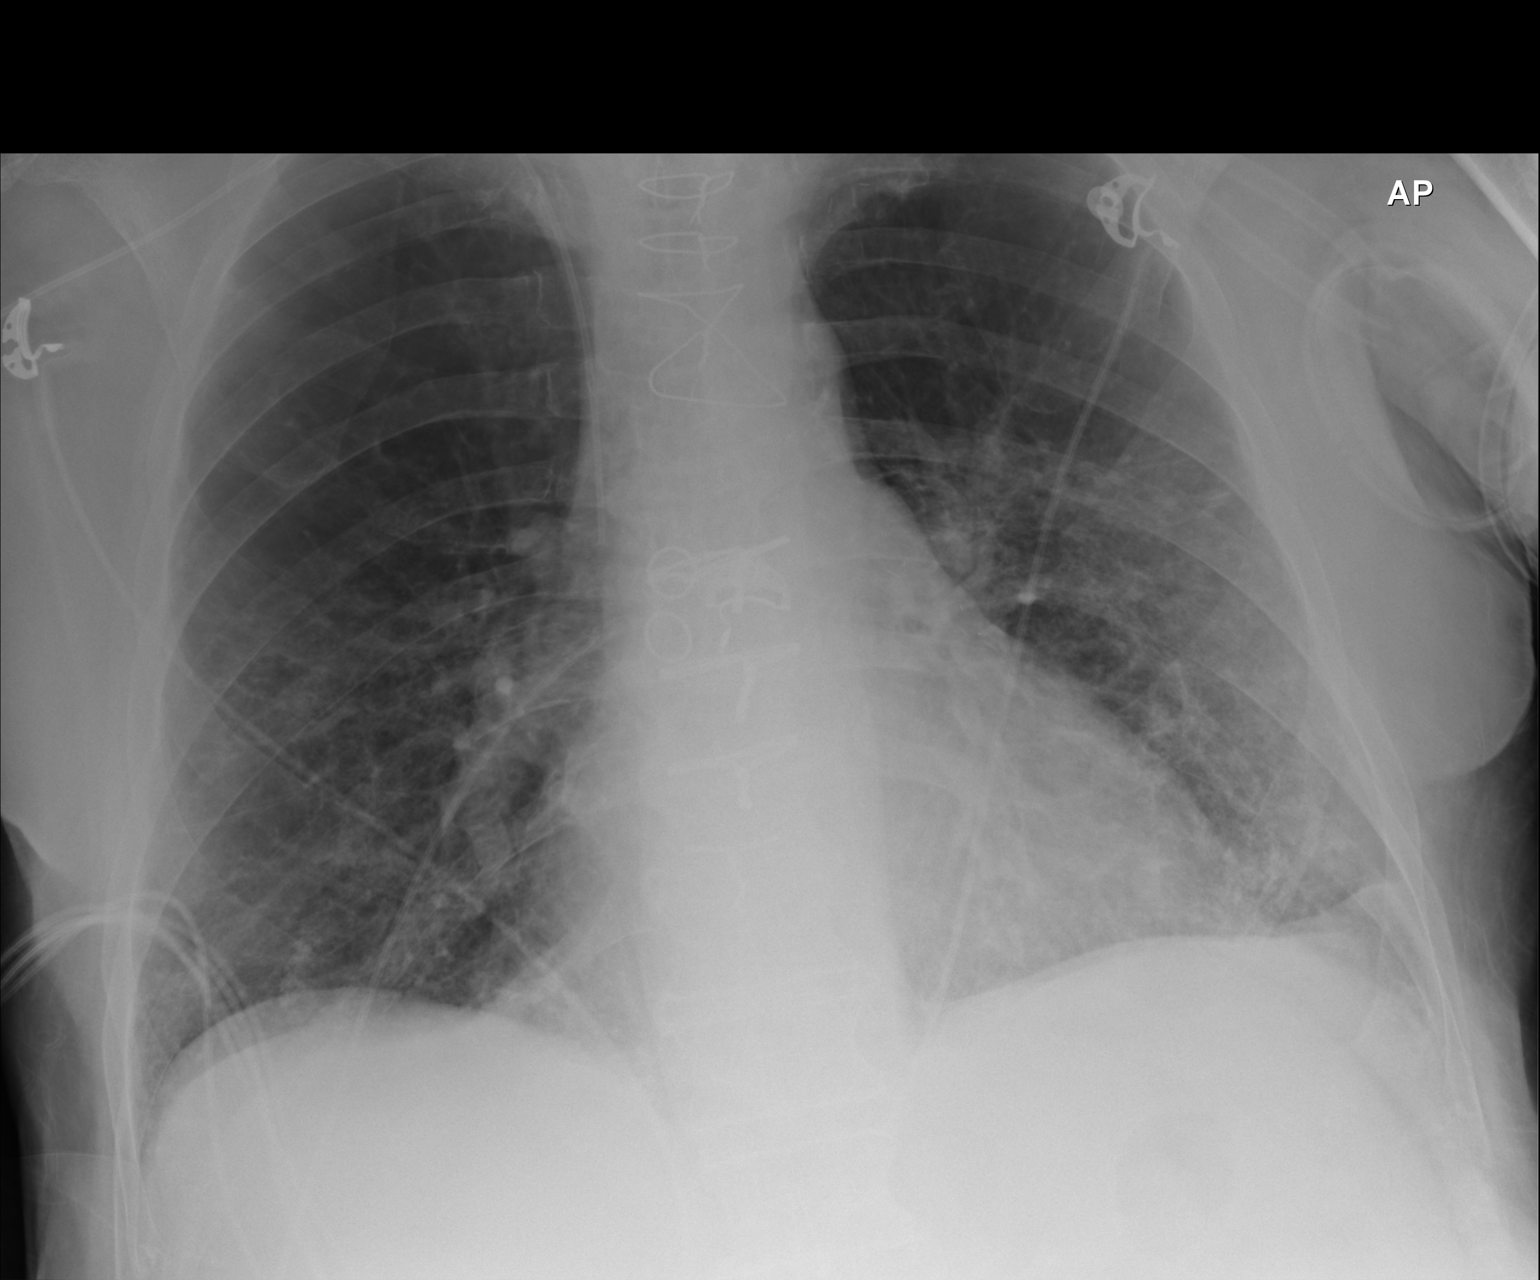

[1 of 1 positions shown; findings below may reference images not displayed]

FINDINGS: Right PICC remains in place. The patient is status post CABG. The
lungs are emphysematous. Left worse than right mid and lower lung
zone airspace disease persists. Aeration on the right shows some
improvement since the 05/28/2013 exam. No pneumothorax or pleural
effusion.
IMPRESSION: Persistent left worse than right airspace disease shows some
improvement, particularly on the right.

Emphysema.

## 2015-02-28 IMAGING — CR DG CHEST 1V PORT
1 series · 1 of 1 positions shown · non-contrast
Comparison: Portable chest x-ray July 20, 2013

CLINICAL DATA: Congestive heart failure with sepsis

EXAM:
PORTABLE CHEST - 1 VIEW

[AP]
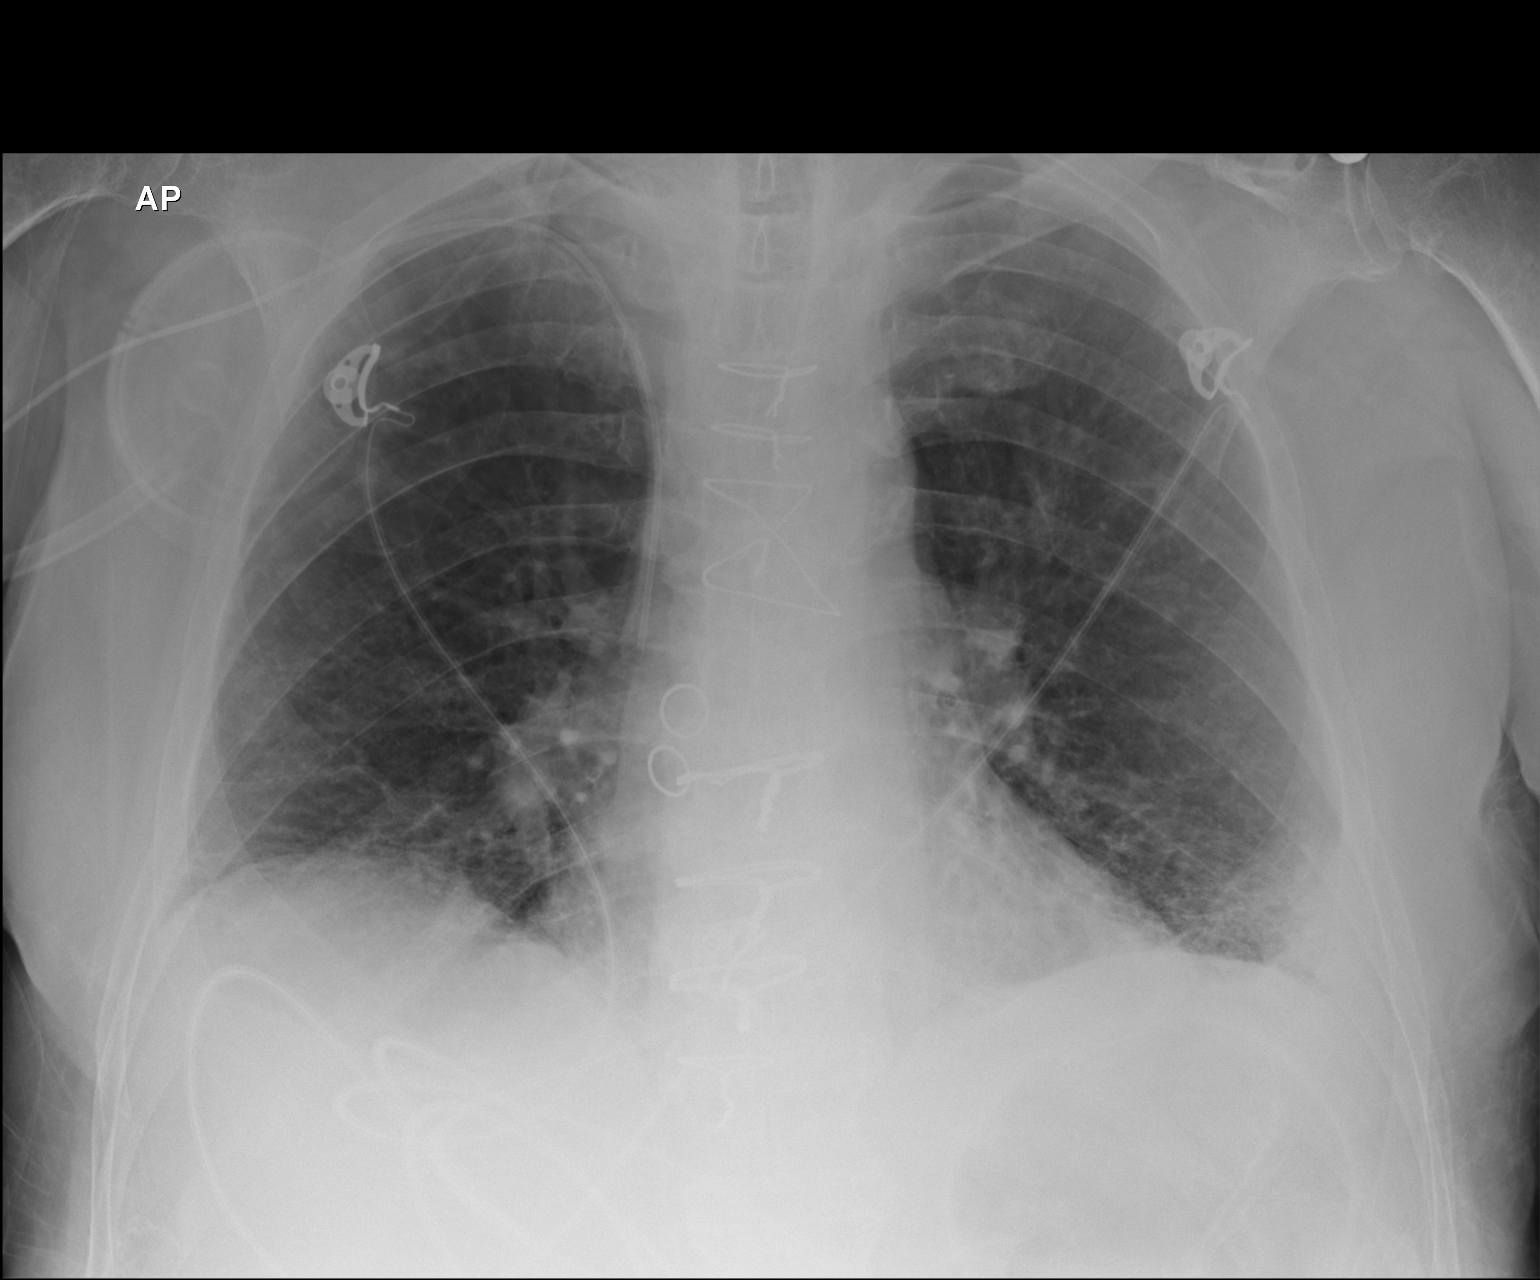

[1 of 1 positions shown; findings below may reference images not displayed]

FINDINGS: The lungs are less well inflated today. The right hemidiaphragm is
distinct. There is persistent density laterally in the left
costophrenic gutter. The cardiac silhouette is top-normal in size.
The pulmonary vascularity is not engorged. There is a PICC line in
place with the tip in the midportion of the SVC. The patient has
undergone previous CABG.
IMPRESSION: Allowing for mild hypo inflation today, there has not been
significant interval change in the appearance of the chest since
yesterday's study.

## 2015-03-02 IMAGING — CR DG CHEST 1V PORT
1 series · 1 of 1 positions shown · non-contrast
Comparison: 07/22/2013

CLINICAL DATA: Respiratory failure

EXAM:
PORTABLE CHEST - 1 VIEW

[AP]
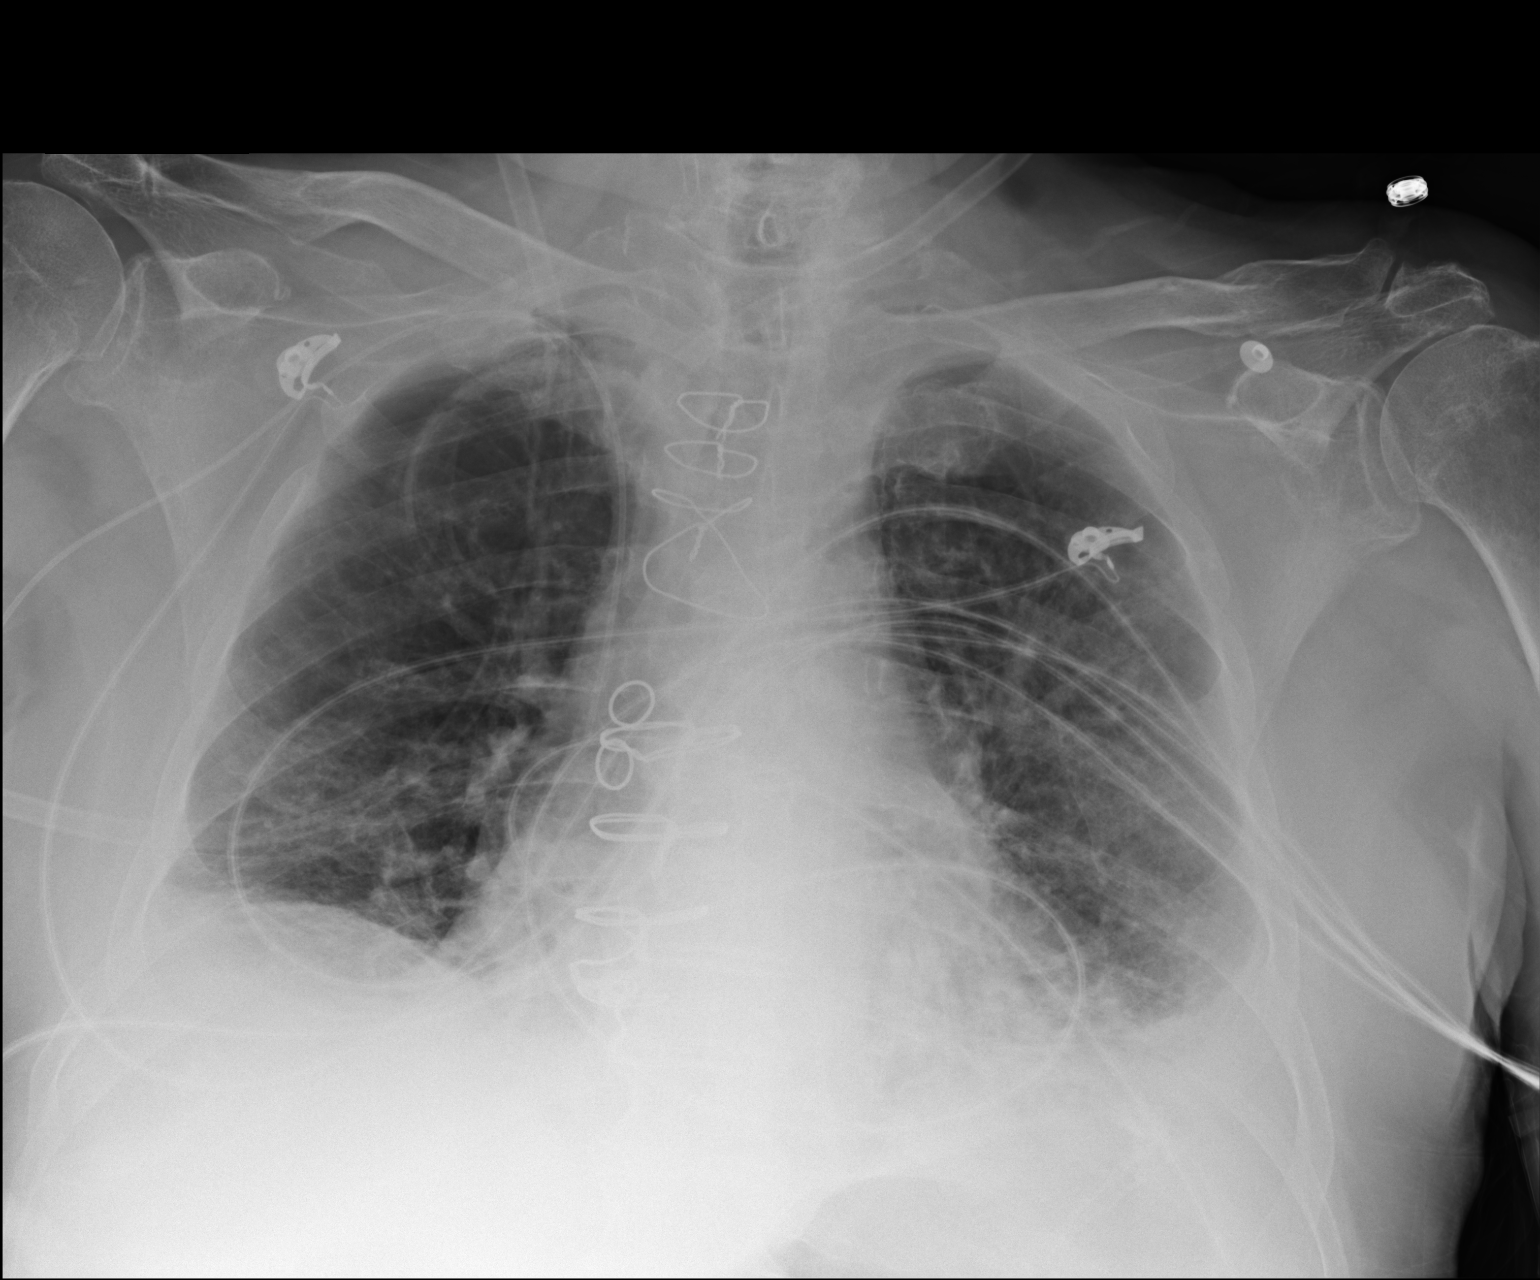

[1 of 1 positions shown; findings below may reference images not displayed]

FINDINGS: Right arm PICC tip in the SVC unchanged.

Increase in bibasilar airspace opacities. Left pleural effusion
unchanged. No significant vascular congestion. Decreased lung volume
compared to the prior study.
IMPRESSION: Decreased lung volume with increase in bibasilar atelectasis. Small
left effusion unchanged.
